# Patient Record
Sex: Female | Born: 1990 | Race: Black or African American | Hispanic: No | Marital: Single | Smoking: Former smoker
Health system: Southern US, Community
[De-identification: ages and names within clinical notes are randomized; demographics above are authoritative.]

## PROBLEM LIST (undated history)

## (undated) ENCOUNTER — Inpatient Hospital Stay (HOSPITAL_COMMUNITY): Payer: Self-pay

## (undated) DIAGNOSIS — A6000 Herpesviral infection of urogenital system, unspecified: Secondary | ICD-10-CM

## (undated) DIAGNOSIS — B9689 Other specified bacterial agents as the cause of diseases classified elsewhere: Secondary | ICD-10-CM

## (undated) DIAGNOSIS — N760 Acute vaginitis: Secondary | ICD-10-CM

## (undated) HISTORY — PX: WISDOM TOOTH EXTRACTION: SHX21

---

## 2009-06-26 ENCOUNTER — Emergency Department (HOSPITAL_COMMUNITY): Admission: EM | Admit: 2009-06-26 | Discharge: 2009-06-26 | Payer: Self-pay | Admitting: Family Medicine

## 2010-01-11 ENCOUNTER — Emergency Department (HOSPITAL_COMMUNITY)
Admission: EM | Admit: 2010-01-11 | Discharge: 2010-01-11 | Payer: Self-pay | Source: Home / Self Care | Admitting: Family Medicine

## 2010-02-15 ENCOUNTER — Emergency Department (HOSPITAL_COMMUNITY)
Admission: EM | Admit: 2010-02-15 | Discharge: 2010-02-15 | Disposition: A | Discharge status: Home / Self Care | Payer: Self-pay | Attending: Emergency Medicine | Admitting: Emergency Medicine

## 2010-02-15 DIAGNOSIS — S61209A Unspecified open wound of unspecified finger without damage to nail, initial encounter: Secondary | ICD-10-CM | POA: Insufficient documentation

## 2010-02-15 DIAGNOSIS — W268XXA Contact with other sharp object(s), not elsewhere classified, initial encounter: Secondary | ICD-10-CM | POA: Insufficient documentation

## 2010-03-23 LAB — POCT I-STAT, CHEM 8
Calcium, Ion: 1.17 mmol/L (ref 1.12–1.32)
Chloride: 103 mEq/L (ref 96–112)
HCT: 41 % (ref 36.0–46.0)
Potassium: 3.6 mEq/L (ref 3.5–5.1)
Sodium: 139 mEq/L (ref 135–145)

## 2010-03-23 LAB — POCT URINALYSIS DIP (DEVICE)
Bilirubin Urine: NEGATIVE
Glucose, UA: NEGATIVE mg/dL
Ketones, ur: NEGATIVE mg/dL

## 2010-03-23 LAB — POCT PREGNANCY, URINE: Preg Test, Ur: NEGATIVE

## 2010-11-30 ENCOUNTER — Emergency Department (INDEPENDENT_AMBULATORY_CARE_PROVIDER_SITE_OTHER)
Admission: EM | Admit: 2010-11-30 | Discharge: 2010-11-30 | Disposition: A | Discharge status: Home / Self Care | Payer: Managed Care, Other (non HMO) | Source: Home / Self Care

## 2010-11-30 DIAGNOSIS — K5900 Constipation, unspecified: Secondary | ICD-10-CM

## 2010-11-30 DIAGNOSIS — B9789 Other viral agents as the cause of diseases classified elsewhere: Secondary | ICD-10-CM

## 2010-11-30 DIAGNOSIS — B349 Viral infection, unspecified: Secondary | ICD-10-CM

## 2010-11-30 MED ORDER — PROMETHAZINE-CODEINE 6.25-10 MG/5ML PO SYRP: ORAL_SOLUTION | ORAL | Status: AC

## 2010-11-30 MED ORDER — DOCUSATE SODIUM 100 MG PO CAPS: 100.0000 mg | ORAL_CAPSULE | Freq: Two times a day (BID) | ORAL | Status: AC | PRN

## 2010-11-30 NOTE — ED Provider Notes (Signed)
History     CSN: [id number] Arrival date & time: 11/30/2010  6:31 PM   None     Chief Complaint  Patient presents with  . Sore Throat    Pt has cough, sorethroat, bodyaches, headache and chills  . Abdominal Pain    (Consider location/radiation/quality/duration/timing/severity/associated sxs/prior treatment) HPI Comments: Onset of cough, sore throat, body aches and fever today. No nasal congestion. Temp was 100 at home. Cough is nonproductive. Also today having cramping abdominal pain. Pt states she feels like she needs to have a BM but only has been able to pass a small hard stool. Uncertain of last normal stool, stating that she has been constipated for a few days now.   Patient is a 20 y.o. female presenting with abdominal pain and cough. The history is provided by the patient.  Abdominal Pain The primary symptoms of the illness include abdominal pain and fever. The primary symptoms of the illness do not include shortness of breath, nausea, vomiting, diarrhea, dysuria or vaginal discharge. The current episode started 6 to 12 hours ago. The onset of the illness was sudden. The problem has not changed since onset. The abdominal pain began 6 to 12 hours ago. The pain came on suddenly. The abdominal pain has been unchanged since its onset. The abdominal pain is generalized. The abdominal pain does not radiate. The abdominal pain is relieved by nothing.  The patient has had a change in bowel habit (pt reports being constipated and only able to pass small hard stool today). Additional symptoms associated with the illness include chills and constipation. Symptoms associated with the illness do not include urgency or frequency.  Cough This is a new problem. The current episode started 12 to 24 hours ago. The problem occurs every few minutes. The problem has not changed since onset.The cough is non-productive. The maximum temperature recorded prior to her arrival was 100 to 100.9 F. The fever has  been present for less than 1 day. Associated symptoms include chills, sore throat and myalgias. Pertinent negatives include no chest pain, no ear pain, no headaches, no rhinorrhea, no shortness of breath and no wheezing. She has tried cough syrup for the symptoms. The treatment provided no relief.    History reviewed. No pertinent past medical history.  History reviewed. No pertinent past surgical history.  History reviewed. No pertinent family history.  History  Substance Use Topics  . Smoking status: Not on file  . Smokeless tobacco: Not on file  . Alcohol Use: No    OB History    Grav Para Term Preterm Abortions TAB SAB Ect Mult Living                  Review of Systems  Constitutional: Positive for fever and chills. Negative for appetite change.  HENT: Positive for sore throat. Negative for ear pain, congestion, rhinorrhea and sinus pressure.   Respiratory: Positive for cough. Negative for shortness of breath and wheezing.   Cardiovascular: Negative for chest pain.  Gastrointestinal: Positive for abdominal pain and constipation. Negative for nausea, vomiting and diarrhea.  Genitourinary: Negative for dysuria, urgency, frequency, decreased urine volume and vaginal discharge.  Musculoskeletal: Positive for myalgias.  Neurological: Negative for headaches.    Allergies  Review of patient's allergies indicates no known allergies.  Home Medications   Current Outpatient Rx  Name Route Sig Dispense Refill  . DOCUSATE SODIUM 100 MG PO CAPS Oral Take 1 capsule (100 mg total) by mouth 2 (two) times daily as  needed for constipation. 20 capsule 0  . PROMETHAZINE-CODEINE 6.25-10 MG/5ML PO SYRP  1-2 tsp every 6 hrs prn cough 120 mL 0    BP 113/67  Pulse 66  Temp(Src) 99.1 F (37.3 C) (Oral)  Resp 17  SpO2 100%  LMP 11/19/2010  Physical Exam  Nursing note and vitals reviewed. Constitutional: She appears well-developed and well-nourished. No distress.  HENT:  Head:  Normocephalic and atraumatic.  Right Ear: Tympanic membrane, external ear and ear canal normal.  Left Ear: Tympanic membrane, external ear and ear canal normal.  Nose: Nose normal.  Mouth/Throat: Uvula is midline, oropharynx is clear and moist and mucous membranes are normal. No oropharyngeal exudate, posterior oropharyngeal edema or posterior oropharyngeal erythema.  Neck: Neck supple.  Cardiovascular: Normal rate, regular rhythm and normal heart sounds.   Pulmonary/Chest: Effort normal and breath sounds normal. No respiratory distress.  Abdominal: Soft. Bowel sounds are normal. She exhibits no distension and no mass. There is no tenderness.  Lymphadenopathy:    She has no cervical adenopathy.  Neurological: She is alert.  Skin: Skin is warm and dry.  Psychiatric: She has a normal mood and affect.    ED Course  Procedures (including critical care time)  Labs Reviewed - No data to display No results found.   1. Viral infection   2. Constipation       MDM          Melody Comas, PA 11/30/10 1914

## 2010-12-01 NOTE — ED Provider Notes (Signed)
Medical screening examination/treatment/procedure(s) were performed by non-physician practitioner and as supervising physician I was immediately available for consultation/collaboration.  Raynald Blend, MD 12/01/10 [phone]

## 2011-07-08 ENCOUNTER — Emergency Department (HOSPITAL_COMMUNITY)
Admission: EM | Admit: 2011-07-08 | Discharge: 2011-07-08 | Disposition: A | Discharge status: Home / Self Care | Payer: Self-pay | Source: Home / Self Care | Attending: Family Medicine | Admitting: Family Medicine

## 2011-07-08 ENCOUNTER — Encounter (HOSPITAL_COMMUNITY): Payer: Self-pay | Admitting: *Deleted

## 2011-07-08 DIAGNOSIS — K529 Noninfective gastroenteritis and colitis, unspecified: Secondary | ICD-10-CM

## 2011-07-08 NOTE — ED Notes (Signed)
Pt    Reports  Nausea  Vomiting  /  Diarrhea          X  2  Days  No  Vomiting  yest  Feels  Like  She  Could  -  Mother  Sick as  Well after  Eating  Same  Place

## 2011-07-08 NOTE — ED Provider Notes (Signed)
History     CSN: [id number]  Arrival date & time 07/08/11  1121   First MD Initiated Contact with Patient 07/08/11 1228      Chief Complaint  Patient presents with  . Nausea    (Consider location/radiation/quality/duration/timing/severity/associated sxs/prior treatment) HPI Comments: Pt's mother has similar sx and they ate together evening of 6/30.  Morning of 7/1 pt had abd cramping and loose stool x1.  Yesterday pt had 2 episodes watery brown stool.  None today.  Has been eating only crackers and drinking only ginger ale and water.    Patient is a 21 y.o. female presenting with diarrhea. The history is provided by the patient.  Diarrhea The primary symptoms include nausea and diarrhea. Primary symptoms do not include fever, vomiting or dysuria. The illness began 2 days ago. The onset was sudden. The problem has been gradually improving.  Nausea began 2 days ago.  The diarrhea began 2 days ago. The diarrhea is watery. The diarrhea occurs 2 to 4 times per day. Risk factors for illness producing diarrhea include suspect food intake.  The illness does not include chills.    History reviewed. No pertinent past medical history.  History reviewed. No pertinent past surgical history.  No family history on file.  History  Substance Use Topics  . Smoking status: Not on file  . Smokeless tobacco: Not on file  . Alcohol Use: No    OB History    Grav Para Term Preterm Abortions TAB SAB Ect Mult Living                  Review of Systems  Constitutional: Negative for fever and chills.  Gastrointestinal: Positive for nausea and diarrhea. Negative for vomiting and blood in stool.  Genitourinary: Negative for dysuria.    Allergies  Review of patient's allergies indicates no known allergies.  Home Medications  No current outpatient prescriptions on file.  BP 106/66  Pulse 53  Temp 98.3 F (36.8 C) (Oral)  Resp 16  SpO2 100%  LMP 06/18/2011  Physical Exam    Constitutional: She appears well-developed and well-nourished. No distress.       Ill appearing  Cardiovascular: Normal rate and regular rhythm.   Pulmonary/Chest: Effort normal and breath sounds normal.  Abdominal: Soft. Bowel sounds are normal. She exhibits no distension. There is no tenderness. There is no rigidity, no rebound, no guarding and no CVA tenderness.  Skin: Skin is warm, dry and intact.    ED Course  Procedures (including critical care time)  Labs Reviewed - No data to display No results found.   1. Enteritis       MDM          Cathlyn Parsons, NP 07/08/11 1233

## 2011-07-08 NOTE — ED Provider Notes (Signed)
Medical screening examination/treatment/procedure(s) were performed by non-physician practitioner and as supervising physician I was immediately available for consultation/collaboration.   [facility], [place]; MD   Sharin Grave, MD 07/08/11 2158

## 2012-01-21 ENCOUNTER — Emergency Department (HOSPITAL_COMMUNITY)
Admission: EM | Admit: 2012-01-21 | Discharge: 2012-01-21 | Disposition: A | Discharge status: Home / Self Care | Payer: Self-pay | Attending: Emergency Medicine | Admitting: Emergency Medicine

## 2012-01-21 ENCOUNTER — Encounter (HOSPITAL_COMMUNITY): Payer: Self-pay | Admitting: Emergency Medicine

## 2012-01-21 DIAGNOSIS — R059 Cough, unspecified: Secondary | ICD-10-CM | POA: Insufficient documentation

## 2012-01-21 DIAGNOSIS — J3489 Other specified disorders of nose and nasal sinuses: Secondary | ICD-10-CM | POA: Insufficient documentation

## 2012-01-21 DIAGNOSIS — R05 Cough: Secondary | ICD-10-CM | POA: Insufficient documentation

## 2012-01-21 DIAGNOSIS — J069 Acute upper respiratory infection, unspecified: Secondary | ICD-10-CM | POA: Insufficient documentation

## 2012-01-21 DIAGNOSIS — H9209 Otalgia, unspecified ear: Secondary | ICD-10-CM | POA: Insufficient documentation

## 2012-01-21 DIAGNOSIS — F17200 Nicotine dependence, unspecified, uncomplicated: Secondary | ICD-10-CM | POA: Insufficient documentation

## 2012-01-21 DIAGNOSIS — R6889 Other general symptoms and signs: Secondary | ICD-10-CM | POA: Insufficient documentation

## 2012-01-21 LAB — RAPID STREP SCREEN (MED CTR MEBANE ONLY): Streptococcus, Group A Screen (Direct): NEGATIVE

## 2012-01-21 MED ORDER — GUAIFENESIN 100 MG/5ML PO LIQD: 100.0000 mg | ORAL | Status: DC | PRN

## 2012-01-21 NOTE — ED Provider Notes (Signed)
Medical screening examination/treatment/procedure(s) were performed by non-physician practitioner and as supervising physician I was immediately available for consultation/collaboration.   Jotham Ahn, MD 01/21/12 1417 

## 2012-01-21 NOTE — ED Provider Notes (Signed)
History     CSN: [id number]  Arrival date & time 01/21/12  0945   None     No chief complaint on file.   (Consider location/radiation/quality/duration/timing/severity/associated sxs/prior treatment) HPI  22 year old female presents c/o L ear pain and sore throat.  Pt reports for the past 1 week she developed gradual onset of sore throat, nasal congestion, L ear pain, and headache.  Has occasional sneeze and cough.  Onset is gradual, persistent, moderate in severity, unrelieved with OTC medication.  Denies fever, chill,s hearing changes, voice changes, cp, sob, abd pain, n/v/d, or rash.    History reviewed. No pertinent past medical history.  History reviewed. No pertinent past surgical history.  No family history on file.  History  Substance Use Topics  . Smoking status: Current Every Day Smoker  . Smokeless tobacco: Not on file  . Alcohol Use: Yes    OB History    Grav Para Term Preterm Abortions TAB SAB Ect Mult Living                  Review of Systems  Constitutional:       10 Systems reviewed and all are negative for acute change except as noted in the HPI.     Allergies  Review of patient's allergies indicates no known allergies.  Home Medications   Current Outpatient Rx  Name  Route  Sig  Dispense  Refill  . NAPROXEN SODIUM 220 MG PO TABS   Oral   Take 220 mg by mouth 2 (two) times daily as needed. For pain           BP 122/52  Pulse 69  Temp 98.3 F (36.8 C)  Resp 16  SpO2 99%  Physical Exam  Nursing note and vitals reviewed. Constitutional: She is oriented to person, place, and time. She appears well-developed and well-nourished. No distress.  HENT:  Head: Normocephalic and atraumatic.  Right Ear: External ear normal.  Left Ear: External ear normal.       Nose: mild rhinorrhea  Throat: uvula midline.  Mild L tonsillar enlargement and exudates.  No evidence of deep tissue infection.    No voice changes.  Tolerates own saliva secretion   Eyes: Conjunctivae normal and EOM are normal. Pupils are equal, round, and reactive to light.  Neck: Normal range of motion. Neck supple.  Cardiovascular: Normal rate and regular rhythm.  Exam reveals no gallop and no friction rub.   No murmur heard. Pulmonary/Chest: Effort normal and breath sounds normal. No respiratory distress. She has no wheezes. She exhibits no tenderness.  Abdominal: Soft. There is no tenderness.  Musculoskeletal: Normal range of motion. She exhibits no edema and no tenderness.  Lymphadenopathy:    She has cervical adenopathy (anterior cervical lymphadenopathy.  ).  Neurological: She is alert and oriented to person, place, and time.  Skin: Skin is warm. No rash noted.  Psychiatric: She has a normal mood and affect.    ED Course  Procedures (including critical care time)   Labs Reviewed  RAPID STREP SCREEN   Results for orders placed during the hospital encounter of 01/21/12  RAPID STREP SCREEN      Component Value Range   Streptococcus, Group A Screen (Direct) NEGATIVE  NEGATIVE   No results found.   1. URI  MDM  Pt presents with sore throat and URI sxs.  Strep test neg.  No evidence of deep tissue infection.  Appears nontoxic, able to swallow without difficulty.  I recommend gargle with salt water and will give guaifenesin for congestions.  ENT referral as needed.    BP 122/52  Pulse 69  Temp 98.3 F (36.8 C)  Resp 16  SpO2 99%  I have reviewed nursing notes and vital signs.  I reviewed available ER/hospitalization records thought the EMR     Fayrene Helper, [place] 01/21/12 1240

## 2012-01-21 NOTE — ED Notes (Signed)
Left ear pain x 1 week  And bad sorethroat also

## 2012-07-25 ENCOUNTER — Encounter (HOSPITAL_COMMUNITY): Payer: Self-pay | Admitting: Emergency Medicine

## 2012-07-25 ENCOUNTER — Emergency Department (HOSPITAL_COMMUNITY)
Admission: EM | Admit: 2012-07-25 | Discharge: 2012-07-25 | Disposition: A | Discharge status: Home / Self Care | Payer: Self-pay | Attending: Emergency Medicine | Admitting: Emergency Medicine

## 2012-07-25 DIAGNOSIS — A6000 Herpesviral infection of urogenital system, unspecified: Secondary | ICD-10-CM | POA: Insufficient documentation

## 2012-07-25 DIAGNOSIS — N760 Acute vaginitis: Secondary | ICD-10-CM | POA: Insufficient documentation

## 2012-07-25 DIAGNOSIS — A499 Bacterial infection, unspecified: Secondary | ICD-10-CM | POA: Insufficient documentation

## 2012-07-25 DIAGNOSIS — Z3202 Encounter for pregnancy test, result negative: Secondary | ICD-10-CM | POA: Insufficient documentation

## 2012-07-25 DIAGNOSIS — B9689 Other specified bacterial agents as the cause of diseases classified elsewhere: Secondary | ICD-10-CM | POA: Insufficient documentation

## 2012-07-25 DIAGNOSIS — F17200 Nicotine dependence, unspecified, uncomplicated: Secondary | ICD-10-CM | POA: Insufficient documentation

## 2012-07-25 LAB — URINALYSIS, ROUTINE W REFLEX MICROSCOPIC
Glucose, UA: NEGATIVE mg/dL
Ketones, ur: NEGATIVE mg/dL
Leukocytes, UA: NEGATIVE
pH: 6.5 (ref 5.0–8.0)

## 2012-07-25 LAB — WET PREP, GENITAL: Trich, Wet Prep: NONE SEEN

## 2012-07-25 LAB — POCT PREGNANCY, URINE: Preg Test, Ur: NEGATIVE

## 2012-07-25 MED ORDER — VALACYCLOVIR HCL 1 G PO TABS: 1000.0000 mg | ORAL_TABLET | Freq: Three times a day (TID) | ORAL | Status: AC

## 2012-07-25 MED ORDER — METRONIDAZOLE 500 MG PO TABS: 500.0000 mg | ORAL_TABLET | Freq: Two times a day (BID) | ORAL | Status: DC

## 2012-07-25 NOTE — ED Notes (Signed)
Pt escorted to discharge window. Pt verbalized understanding discharge instructions. In no acute distress.  

## 2012-07-25 NOTE — Progress Notes (Signed)
[facility] has seen patient. Provided her with a list of primary resources.

## 2012-07-25 NOTE — ED Notes (Signed)
Pt states her partner was diagnosed with herpes. Pt reports vaginal irritation and bumps. Pt reports thin discharge.

## 2012-07-25 NOTE — ED Provider Notes (Signed)
   History    CSN: [id number] Arrival date & time 07/25/12  0803  First MD Initiated Contact with Patient 07/25/12 [phone]     Chief Complaint  Patient presents with  . Vaginal Itching   (Consider location/radiation/quality/duration/timing/severity/associated sxs/prior Treatment) Patient is a 22 y.o. female presenting with vaginal itching. The history is provided by the patient.  Vaginal Itching   patient here complaining of vaginal pain at her right labia the rash. Is concerned that she may have been exposed to genital herpes. Denies any anginal bleeding or discharge. No abdominal pain. No fever. Denies any prior history of STDs. Symptoms constant and have been recurrent. No treatment used prior to arrival History reviewed. No pertinent past medical history. History reviewed. No pertinent past surgical history. History reviewed. No pertinent family history. History  Substance Use Topics  . Smoking status: Current Every Day Smoker  . Smokeless tobacco: Not on file  . Alcohol Use: Yes   OB History   Grav Para Term Preterm Abortions TAB SAB Ect Mult Living                 Review of Systems  All other systems reviewed and are negative.    Allergies  Review of patient's allergies indicates no known allergies.  Home Medications  No current outpatient prescriptions on file. BP 100/62  Pulse 56  Temp(Src) 97.7 F (36.5 C) (Oral)  Resp 16  SpO2 100% Physical Exam  Nursing note and vitals reviewed. Constitutional: She is oriented to person, place, and time. She appears well-developed and well-nourished.  Non-toxic appearance. No distress.  HENT:  Head: Normocephalic and atraumatic.  Eyes: Conjunctivae, EOM and lids are normal. Pupils are equal, round, and reactive to light.  Neck: Normal range of motion. Neck supple. No tracheal deviation present. No mass present.  Cardiovascular: Normal rate, regular rhythm and normal heart sounds.  Exam reveals no gallop.   No murmur  heard. Pulmonary/Chest: Effort normal and breath sounds normal. No stridor. No respiratory distress. She has no decreased breath sounds. She has no wheezes. She has no rhonchi. She has no rales.  Abdominal: Soft. Normal appearance and bowel sounds are normal. She exhibits no distension. There is no tenderness. There is no rebound and no CVA tenderness.  Genitourinary:     Musculoskeletal: Normal range of motion. She exhibits no edema and no tenderness.  Neurological: She is alert and oriented to person, place, and time. She has normal strength. No cranial nerve deficit or sensory deficit. GCS eye subscore is 4. GCS verbal subscore is 5. GCS motor subscore is 6.  Skin: Skin is warm and dry. No abrasion and no rash noted.  Psychiatric: She has a normal mood and affect. Her speech is normal and behavior is normal.    ED Course  Procedures (including critical care time) Labs Reviewed  WET PREP, GENITAL  GC/CHLAMYDIA PROBE AMP  URINALYSIS, ROUTINE W REFLEX MICROSCOPIC   No results found. No diagnosis found.  MDM  Patient had an RPR and HIV test done which are pending at this time. Patient's lesions consistent with herpes and be treated for such. She was also given safe sex counseling  Toy Baker, MD 07/25/12 [phone]

## 2012-07-27 NOTE — ED Notes (Signed)
Chart sent to EDP office for review °

## 2012-07-28 ENCOUNTER — Telehealth (HOSPITAL_COMMUNITY): Payer: Self-pay | Admitting: *Deleted

## 2012-07-29 NOTE — ED Notes (Signed)
Chart returned from EDP office.Rocephin 250 mg IM x 1.Advise partner should be tested/treated. Written by Sharilyn Sites

## 2012-07-31 ENCOUNTER — Emergency Department (HOSPITAL_COMMUNITY)
Admission: EM | Admit: 2012-07-31 | Discharge: 2012-07-31 | Disposition: A | Discharge status: Home / Self Care | Payer: Self-pay | Attending: Emergency Medicine | Admitting: Emergency Medicine

## 2012-07-31 ENCOUNTER — Encounter (HOSPITAL_COMMUNITY): Payer: Self-pay | Admitting: Emergency Medicine

## 2012-07-31 ENCOUNTER — Telehealth (HOSPITAL_COMMUNITY): Payer: Self-pay | Admitting: Emergency Medicine

## 2012-07-31 DIAGNOSIS — F17200 Nicotine dependence, unspecified, uncomplicated: Secondary | ICD-10-CM | POA: Insufficient documentation

## 2012-07-31 DIAGNOSIS — A549 Gonococcal infection, unspecified: Secondary | ICD-10-CM

## 2012-07-31 DIAGNOSIS — N898 Other specified noninflammatory disorders of vagina: Secondary | ICD-10-CM | POA: Insufficient documentation

## 2012-07-31 DIAGNOSIS — Z8619 Personal history of other infectious and parasitic diseases: Secondary | ICD-10-CM | POA: Insufficient documentation

## 2012-07-31 DIAGNOSIS — A5400 Gonococcal infection of lower genitourinary tract, unspecified: Secondary | ICD-10-CM | POA: Insufficient documentation

## 2012-07-31 DIAGNOSIS — Z79899 Other long term (current) drug therapy: Secondary | ICD-10-CM | POA: Insufficient documentation

## 2012-07-31 HISTORY — DX: Herpesviral infection of urogenital system, unspecified: A60.00

## 2012-07-31 HISTORY — DX: Other specified bacterial agents as the cause of diseases classified elsewhere: N76.0

## 2012-07-31 HISTORY — DX: Acute vaginitis: B96.89

## 2012-07-31 MED ORDER — CEFTRIAXONE SODIUM 250 MG IJ SOLR
250.0000 mg | Freq: Once | INTRAMUSCULAR | Status: AC
  Administered 2012-07-31: 250 mg via INTRAMUSCULAR
  Filled 2012-07-31: qty 250

## 2012-07-31 NOTE — ED Provider Notes (Signed)
Medical screening examination/treatment/procedure(s) were performed by non-physician practitioner and as supervising physician I was immediately available for consultation/collaboration.  Renay Crammer, MD 07/31/12 0507 

## 2012-07-31 NOTE — ED Provider Notes (Signed)
CSN: [id number]     Arrival date & time 07/31/12  0013 History     First MD Initiated Contact with Patient 07/31/12 0037     Chief Complaint  Patient presents with  . SEXUALLY TRANSMITTED DISEASE   (Consider location/radiation/quality/duration/timing/severity/associated sxs/prior Treatment) The history is provided by the patient and medical records. No language interpreter was used.    Joy [name] is a 22 y.o. female  with a hx of genital herpes and BV presents to the Emergency Department after checking on her labs to find out she had a positive Gonorrhea test.  Pt states she continues to have vagina discharge but has no other symptoms.  Pt states the herpes outbreak in healing. Nothing makes it better or worse.  Pt denies fever, chills, headache, neck pain, chest pain, shortness of breath, abdominal pain, nausea, vomiting, diarrhea, weakness, dizziness, syncope, dysuria, hematuria.     Past Medical History  Diagnosis Date  . Genital herpes   . BV (bacterial vaginosis)    History reviewed. No pertinent past surgical history. No family history on file. History  Substance Use Topics  . Smoking status: Current Every Day Smoker  . Smokeless tobacco: Not on file  . Alcohol Use: Yes   OB History   Grav Para Term Preterm Abortions TAB SAB Ect Mult Living                 Review of Systems  Constitutional: Negative for fever, diaphoresis, appetite change, fatigue and unexpected weight change.  HENT: Negative for mouth sores and neck stiffness.   Respiratory: Negative for cough, chest tightness, shortness of breath and wheezing.   Cardiovascular: Negative for chest pain.  Gastrointestinal: Negative for nausea, vomiting, abdominal pain, diarrhea and constipation.  Genitourinary: Positive for vaginal discharge. Negative for dysuria, urgency, frequency and hematuria.  Musculoskeletal: Negative for back pain.  Skin: Negative for rash.  Allergic/Immunologic: Negative for  immunocompromised state.  Neurological: Negative for syncope, light-headedness and headaches.  Hematological: Does not bruise/bleed easily.  Psychiatric/Behavioral: Negative for sleep disturbance. The patient is not nervous/anxious.     Allergies  Review of patient's allergies indicates no known allergies.  Home Medications   Current Outpatient Rx  Name  Route  Sig  Dispense  Refill  . metroNIDAZOLE (FLAGYL) 500 MG tablet   Oral   Take 1 tablet (500 mg total) by mouth 2 (two) times daily.   14 tablet   0   . valACYclovir (VALTREX) 1000 MG tablet   Oral   Take 1 tablet (1,000 mg total) by mouth 3 (three) times daily.   30 tablet   0    BP 119/69  Pulse 60  Temp(Src) 99.2 F (37.3 C) (Oral)  Resp 18  Ht 5\' 8"  (1.727 m)  Wt 210 lb (95.255 kg)  BMI 31.94 kg/m2  SpO2 98%  LMP 07/05/2012 Physical Exam  Nursing note and vitals reviewed. Constitutional: She appears well-developed and well-nourished. No distress.  Awake, alert, nontoxic appearance  HENT:  Head: Normocephalic and atraumatic.  Mouth/Throat: Oropharynx is clear and moist. No oropharyngeal exudate.  Eyes: Conjunctivae are normal. No scleral icterus.  Neck: Normal range of motion.  Cardiovascular: Normal rate, regular rhythm, normal heart sounds and intact distal pulses.   No murmur heard. Pulmonary/Chest: Effort normal and breath sounds normal. No respiratory distress. She has no wheezes.  Abdominal: Soft. Bowel sounds are normal. She exhibits no distension. There is no tenderness. There is no rebound.  Musculoskeletal: Normal range of motion. She  exhibits no edema.  Neurological: She is alert.  Speech is clear and goal oriented Moves extremities without ataxia  Skin: Skin is warm and dry. She is not diaphoretic. No erythema.  Psychiatric: She has a normal mood and affect.    ED Course   Procedures (including critical care time)  Labs Reviewed - No data to display No results found. 1. Gonorrhea in  female     MDM  Joy [name] presents for treatment of a positive gonorrhea culture taken on 07/25/2012.  Patient was not treated the day of the visit.  Patient to be discharged with instructions to follow up with OBGYN. Discussed importance of using protection when sexually active. Pt understands that they will need to inform all sexual partners. Pt has been treated rocephin.  I have also discussed reasons to return immediately to the ER.  Patient expresses understanding and agrees with plan.    Dahlia Client Dalonte Hardage, PA-C 07/31/12 [phone]

## 2012-07-31 NOTE — ED Notes (Signed)
Unable to contact patient via phone. Sent letter. °

## 2012-07-31 NOTE — ED Notes (Signed)
Pt returned to ED to discuss results of GC probe here on 7/21, pt did have positive result. Pt received no antibiotics the day of visit.

## 2014-12-08 ENCOUNTER — Encounter (HOSPITAL_COMMUNITY): Payer: Self-pay

## 2014-12-08 ENCOUNTER — Inpatient Hospital Stay (HOSPITAL_COMMUNITY)
Admission: AD | Admit: 2014-12-08 | Discharge: 2014-12-08 | Disposition: A | Discharge status: Home / Self Care | Payer: Medicaid Other | Source: Ambulatory Visit | Attending: Obstetrics & Gynecology | Admitting: Obstetrics & Gynecology

## 2014-12-08 DIAGNOSIS — Z3200 Encounter for pregnancy test, result unknown: Secondary | ICD-10-CM | POA: Diagnosis present

## 2014-12-08 DIAGNOSIS — Z3201 Encounter for pregnancy test, result positive: Secondary | ICD-10-CM

## 2014-12-08 LAB — URINALYSIS, ROUTINE W REFLEX MICROSCOPIC
Glucose, UA: NEGATIVE mg/dL
Hgb urine dipstick: NEGATIVE
Ketones, ur: >80 mg/dL — AB
Leukocytes, UA: NEGATIVE
Nitrite: NEGATIVE
Protein, ur: NEGATIVE mg/dL
Specific Gravity, Urine: 1.025 (ref 1.005–1.030)
pH: 6.5 (ref 5.0–8.0)

## 2014-12-08 LAB — POCT PREGNANCY, URINE: Preg Test, Ur: POSITIVE — AB

## 2014-12-08 MED ORDER — PROMETHAZINE HCL 25 MG PO TABS: 25.0000 mg | ORAL_TABLET | Freq: Four times a day (QID) | ORAL | Status: DC | PRN

## 2014-12-08 NOTE — Discharge Instructions (Signed)
Prenatal Care °WHAT IS PRENATAL CARE?  °Prenatal care is the process of caring for a pregnant woman before she gives birth. Prenatal care makes sure that she and her baby remain as healthy as possible throughout pregnancy. Prenatal care may be provided by a midwife, family practice health care provider, or a childbirth and pregnancy specialist (obstetrician). Prenatal care may include physical examinations, testing, treatments, and education on nutrition, lifestyle, and social support services. °WHY IS PRENATAL CARE SO IMPORTANT?  °Early and consistent prenatal care increases the chance that you and your baby will remain healthy throughout your pregnancy. This type of care also decreases a baby's risk of being born too early (prematurely), or being born smaller than expected (small for gestational age). Any underlying medical conditions you may have that could pose a risk during your pregnancy are discussed during prenatal care visits. You will also be monitored regularly for any new conditions that may arise during your pregnancy so they can be treated quickly and effectively. °WHAT HAPPENS DURING PRENATAL CARE VISITS? °Prenatal care visits may include the following: °Discussion °Tell your health care provider about any new signs or symptoms you have experienced since your last visit. These might include: °· Nausea or vomiting. °· Increased or decreased level of energy. °· Difficulty sleeping. °· Back or leg pain. °· Weight changes. °· Frequent urination. °· Shortness of breath with physical activity. °· Changes in your skin, such as the development of a rash or itchiness. °· Vaginal discharge or bleeding. °· Feelings of excitement or nervousness. °· Changes in your baby's movements. °You may want to write down any questions or topics you want to discuss with your health care provider and bring them with you to your appointment. °Examination °During your first prenatal care visit, you will likely have a complete  physical exam. Your health care provider will often examine your vagina, cervix, and the position of your uterus, as well as check your heart, lungs, and other body systems. As your pregnancy progresses, your health care provider will measure the size of your uterus and your baby's position inside your uterus. He or she may also examine you for early signs of labor. Your prenatal visits may also include checking your blood pressure and, after about 10-12 weeks of pregnancy, listening to your baby's heartbeat. °Testing °Regular testing often includes: °· Urinalysis. This checks your urine for glucose, protein, or signs of infection. °· Blood count. This checks the levels of white and red blood cells in your body. °· Tests for sexually transmitted infections (STIs). Testing for STIs at the beginning of pregnancy is routinely done and is required in many states. °· Antibody testing. You will be checked to see if you are immune to certain illnesses, such as rubella, that can affect a developing fetus. °· Glucose screen. Around 24-28 weeks of pregnancy, your blood glucose level will be checked for signs of gestational diabetes. Follow-up tests may be recommended. °· Group B strep. This is a bacteria that is commonly found inside a woman's vagina. This test will inform your health care provider if you need an antibiotic to reduce the amount of this bacteria in your body prior to labor and childbirth. °· Ultrasound. Many pregnant women undergo an ultrasound screening around 18-20 weeks of pregnancy to evaluate the health of the fetus and check for any developmental abnormalities. °· HIV (human immunodeficiency virus) testing. Early in your pregnancy, you will be screened for HIV. If you are at high risk for HIV, this test   may be repeated during your third trimester of pregnancy. °You may be offered other testing based on your age, personal or family medical history, or other factors.  °HOW OFTEN SHOULD I PLAN TO SEE MY  HEALTH CARE PROVIDER FOR PRENATAL CARE? °Your prenatal care check-up schedule depends on any medical conditions you have before, or develop during, your pregnancy. If you do not have any underlying medical conditions, you will likely be seen for checkups: °· Monthly, during the first 6 months of pregnancy. °· Twice a month during months 7 and 8 of pregnancy. °· Weekly starting in the 9th month of pregnancy and until delivery. °If you develop signs of early labor or other concerning signs or symptoms, you may need to see your health care provider more often. Ask your health care provider what prenatal care schedule is best for you. °WHAT CAN I DO TO KEEP MYSELF AND MY BABY AS HEALTHY AS POSSIBLE DURING MY PREGNANCY? °· Take a prenatal vitamin containing 400 micrograms (0.4 mg) of folic acid every day. Your health care provider may also ask you to take additional vitamins such as iodine, vitamin D, iron, copper, and zinc. °· Take 1500-2000 mg of calcium daily starting at your 20th week of pregnancy until you deliver your baby. °· Make sure you are up to date on your vaccinations. Unless directed otherwise by your health care provider: °¨ You should receive a tetanus, diphtheria, and pertussis (Tdap) vaccination between the 27th and 36th week of your pregnancy, regardless of when your last Tdap immunization occurred. This helps protect your baby from whooping cough (pertussis) after he or she is born. °¨ You should receive an annual inactivated influenza vaccine (IIV) to help protect you and your baby from influenza. This can be done at any point during your pregnancy. °· Eat a well-rounded diet that includes: °¨ Fresh fruits and vegetables. °¨ Lean proteins. °¨ Calcium-rich foods such as milk, yogurt, hard cheeses, and dark, leafy greens. °¨ Whole grain breads. °· Do not eat seafood high in mercury, including: °¨ Swordfish. °¨ Tilefish. °¨ Shark. °¨ King mackerel. °¨ More than 6 oz tuna per week. °· Do not eat: °¨ Raw  or undercooked meats or eggs. °¨ Unpasteurized foods, such as soft cheeses (brie, blue, or feta), juices, and milks. °¨ Lunch meats. °¨ Hot dogs that have not been heated until they are steaming. °· Drink enough water to keep your urine clear or pale yellow. For many women, this may be 10 or more 8 oz glasses of water each day. Keeping yourself hydrated helps deliver nutrients to your baby and may prevent the start of pre-term uterine contractions. °· Do not use any tobacco products including cigarettes, chewing tobacco, or electronic cigarettes. If you need help quitting, ask your health care provider. °· Do not drink beverages containing alcohol. No safe level of alcohol consumption during pregnancy has been determined. °· Do not use any illegal drugs. These can harm your developing baby or cause a miscarriage. °· Ask your health care provider or pharmacist before taking any prescription or over-the-counter medicines, herbs, or supplements. °· Limit your caffeine intake to no more than 200 mg per day. °· Exercise. Unless told otherwise by your health care provider, try to get 30 minutes of moderate exercise most days of the week. Do not  do high-impact activities, contact sports, or activities with a high risk of falling, such as horseback riding or downhill skiing. °· Get plenty of rest. °· Avoid anything that raises your   body temperature, such as hot tubs and saunas.  If you own a cat, do not empty its litter box. Bacteria contained in cat feces can cause an infection called toxoplasmosis. This can result in serious harm to the fetus.  Stay away from chemicals such as insecticides, lead, mercury, and cleaning or paint products that contain solvents.  Do not have any X-rays taken unless medically necessary.  Take a childbirth and breastfeeding preparation class. Ask your health care provider if you need a referral or recommendation.   This information is not intended to replace advice given to you by  your health care provider. Make sure you discuss any questions you have with your health care provider.   Document Released: 12/25/2002 Document Revised: 01/12/2014 Document Reviewed: 03/08/2013 Elsevier Interactive Patient Education 2016 [employer] Prenatal Care Providers [facility]    [facility]  [phone]     Phone: [phone]          [facility]                      [facility]  @[place]     Phone: [phone]  Phone: [phone]         [facility]     Phone: [phone]  Phone: [phone]           [facility] [place]                hone: [phone]  Phone: [phone]         [facility] Dr[facility] Francoise CeoBernard Marshall      Phone: [phone]  Phone: [phone]         [facility]                Phone: [phone]  [facility]   [phone]:[phone]    [facility] [address] [place]([place])          Phone: [phone] [facility]   Phone: [phone] Medications in Pregnancy   Acne: Benzoyl Peroxide Salicylic Acid  Backache/Headache: Tylenol: 2 regular strength every 4 hours OR              2 Extra strength every 6 hours  Colds/Coughs/Allergies: Benadryl (alcohol free) 25 mg every 6 hours as needed Breath right strips Claritin Cepacol throat lozenges Chloraseptic throat spray Cold-Eeze- up to three times per day Cough drops, alcohol free Flonase (by prescription only) Guaifenesin Mucinex Robitussin DM (plain only, alcohol free) Saline nasal spray/drops Sudafed (pseudoephedrine) & Actifed ** use only after [redacted] weeks gestation and if you do not have high blood pressure Tylenol Vicks Vaporub Zinc lozenges Zyrtec   Constipation: Colace Ducolax suppositories Fleet enema Glycerin suppositories Metamucil Milk of magnesia Miralax Senokot Smooth move  tea  Diarrhea: Kaopectate Imodium A-D  *NO pepto Bismol  Hemorrhoids: Anusol Anusol HC Preparation H Tucks  Indigestion: Tums Maalox Mylanta Zantac  Pepcid  Insomnia: Benadryl (alcohol free) 25mg  every 6 hours as needed Tylenol PM Unisom, no Gelcaps  Leg Cramps: Tums MagGel  Nausea/Vomiting:  Bonine Dramamine Emetrol Ginger extract Sea bands Meclizine  Nausea medication to take during pregnancy:  Unisom (doxylamine succinate 25 mg tablets) Take one tablet daily at bedtime. If symptoms are not adequately controlled, the dose can be increased to a maximum recommended dose of two tablets daily (1/2 tablet in the morning, 1/2  tablet mid-afternoon and one at bedtime). Vitamin B6  tablets. Take one tablet twice a day (up to 200 mg per day).  Skin Rashes: Aveeno products Benadryl cream or  every 6 hours as needed Calamine Lotion 1% cortisone cream  Yeast infection: Gyne-lotrimin 7 Monistat 7   **If taking multiple medications, please check labels to avoid duplicating the same active ingredients **take medication as directed on the label ** Do not exceed 4000 mg of tylenol in 24 hours **Do not take medications that contain aspirin or ibuprofen   Safe Medications in Pregnancy   Acne: Benzoyl Peroxide Salicylic Acid  Backache/Headache: Tylenol: 2 regular strength every 4 hours OR              2 Extra strength every 6 hours  Colds/Coughs/Allergies: Benadryl (alcohol free) 25 mg every 6 hours as needed Breath right strips Claritin Cepacol throat lozenges Chloraseptic throat spray Cold-Eeze- up to three times per day Cough drops, alcohol free Flonase (by prescription only) Guaifenesin Mucinex Robitussin DM (plain only, alcohol free) Saline nasal spray/drops Sudafed (pseudoephedrine) & Actifed ** use only after [redacted] weeks gestation and if you do not have high blood pressure Tylenol Vicks Vaporub Zinc lozenges Zyrtec    Constipation: Colace Ducolax suppositories Fleet enema Glycerin suppositories Metamucil Milk of magnesia Miralax Senokot Smooth move tea  Diarrhea: Kaopectate Imodium A-D  *NO pepto Bismol  Hemorrhoids: Anusol Anusol HC Preparation H Tucks  Indigestion: Tums Maalox Mylanta Zantac  Pepcid  Insomnia: Benadryl (alcohol free)  every 6 hours as needed Tylenol PM Unisom, no Gelcaps  Leg Cramps: Tums MagGel  Nausea/Vomiting:  Bonine Dramamine Emetrol Ginger extract Sea bands Meclizine  Nausea medication to take during pregnancy:  Unisom (doxylamine succinate 25 mg tablets) Take one tablet daily at bedtime. If symptoms are not adequately controlled, the dose can be increased to a maximum recommended dose of two tablets daily (1/2 tablet in the morning, 1/2 tablet mid-afternoon and one at bedtime). Vitamin B6  tablets. Take one tablet twice a day (up to 200 mg per day).  Skin Rashes: Aveeno products Benadryl cream or  every 6 hours as needed Calamine Lotion 1% cortisone cream  Yeast infection: Gyne-lotrimin 7 Monistat 7   **If taking multiple medications, please check labels to avoid duplicating the same active ingredients **take medication as directed on the label ** Do not exceed 4000 mg of tylenol in 24 hours **Do not take medications that contain aspirin or ibuprofen

## 2014-12-08 NOTE — MAU Provider Note (Signed)
S:  Joy [name] is a 24 y.o. G1P0 at 3284w4d wks here for confirmation of pregnancy.  Patient's Patient's last menstrual period was 10/30/2014.Marland Kitchen.  Denies any vaginal bleeding or abdominal pain.    O:  Past Medical History  Diagnosis Date  . Genital herpes   . BV (bacterial vaginosis)     No family history on file.   Filed Vitals:   12/08/14 1800  BP: 119/55  Pulse: 70  Temp: 98.5 F (36.9 C)  Resp: 18   General:  A&OX3 with no signs of acute distress. She appears well-developed and well-nourished. No distress.  Neck: Normal range of motion.  Pulmonary/Chest: Effort normal. No respiratory distress.  Musculoskeletal: Normal range of motion.  Neurological: She is alert and oriented to person, place, and time.  Skin: Skin is warm and dry.   A: Positive Pregnancy Test  P: Explained benefits of taking prenatal vitamins w/folic acid early in pregnancy. Begin prenatal care. Reviewed warning signs of pregnancy.  Rhea PinkLori A Clemmons, CNM S:  Joy Cobiarin Rogus is a 24 y.o. G1P0 at 6484w4d wks here for confirmation of pregnancy.  Patient's Patient's last menstrual period was 10/30/2014.Marland Kitchen.  Denies any vaginal bleeding or abdominal pain.Val [place].  O:  Past Medical History  Diagnosis Date  . Genital herpes   . BV (bacterial vaginosis)     No family history on file.   Filed Vitals:   12/08/14 1800  BP: 119/55  Pulse: 70  Temp: 98.5 F (36.9 C)  Resp: 18   General:  A&OX3 with no signs of acute distress. She appears well-developed and well-nourished. No distress.  Neck: Normal range of motion.  Pulmonary/Chest: Effort normal. No respiratory distress.  Musculoskeletal: Normal range of motion.  Neurological: She is alert and oriented to person, place, and time.  Skin: Skin is warm and dry.   A: Positive Pregnancy Test  P: Explained benefits of taking prenatal vitamins w/folic acid early in pregnancy. Begin prenatal care. Reviewed warning signs of pregnancy.  Rhea PinkLori A Clemmons, CNM

## 2014-12-08 NOTE — MAU Note (Signed)
Missed period, thinks she is pregnant.  +HPT on 11/30.  Here today because she is really nauseous.

## 2014-12-26 LAB — OB RESULTS CONSOLE HIV ANTIBODY (ROUTINE TESTING): HIV: NONREACTIVE

## 2014-12-26 LAB — OB RESULTS CONSOLE GC/CHLAMYDIA
Chlamydia: NEGATIVE
Gonorrhea: NEGATIVE

## 2014-12-26 LAB — OB RESULTS CONSOLE HEPATITIS B SURFACE ANTIGEN: HEP B S AG: NEGATIVE

## 2014-12-26 LAB — OB RESULTS CONSOLE RUBELLA ANTIBODY, IGM: Rubella: IMMUNE

## 2014-12-26 LAB — OB RESULTS CONSOLE ABO/RH: RH TYPE: POSITIVE

## 2014-12-26 LAB — OB RESULTS CONSOLE RPR: RPR: NONREACTIVE

## 2014-12-26 LAB — OB RESULTS CONSOLE ANTIBODY SCREEN: ANTIBODY SCREEN: NEGATIVE

## 2015-01-06 NOTE — L&D Delivery Note (Signed)
Delivery Note At 2:45 PM a viable female was delivered via Vaginal, Spontaneous Delivery (Presentation: ; Occiput Anterior).  APGAR: 9, 9; weight  pending Placenta status: delivered Cord: 3 vessels with the following complications: None.    Anesthesia: Epidural  Episiotomy:  none Lacerations: 2nd degree Suture Repair: 2.0 vicryl Est. Blood Loss (mL): 300  Mom to postpartum.  Baby to Couplet care / Skin to Skin.  Myna Hidalgo, M 07/27/2015, 3:05 PM

## 2015-04-02 ENCOUNTER — Encounter (HOSPITAL_COMMUNITY): Payer: Self-pay | Admitting: *Deleted

## 2015-04-02 ENCOUNTER — Inpatient Hospital Stay (HOSPITAL_COMMUNITY): Payer: Medicaid Other

## 2015-04-02 ENCOUNTER — Inpatient Hospital Stay (HOSPITAL_COMMUNITY)
Admission: AD | Admit: 2015-04-02 | Discharge: 2015-04-02 | Disposition: A | Discharge status: Home / Self Care | Payer: Medicaid Other | Source: Ambulatory Visit | Attending: Obstetrics & Gynecology | Admitting: Obstetrics & Gynecology

## 2015-04-02 DIAGNOSIS — Y990 Civilian activity done for income or pay: Secondary | ICD-10-CM | POA: Diagnosis not present

## 2015-04-02 DIAGNOSIS — O98312 Other infections with a predominantly sexual mode of transmission complicating pregnancy, second trimester: Secondary | ICD-10-CM | POA: Insufficient documentation

## 2015-04-02 DIAGNOSIS — O26892 Other specified pregnancy related conditions, second trimester: Secondary | ICD-10-CM | POA: Diagnosis not present

## 2015-04-02 DIAGNOSIS — W19XXXA Unspecified fall, initial encounter: Secondary | ICD-10-CM | POA: Diagnosis not present

## 2015-04-02 DIAGNOSIS — O99332 Smoking (tobacco) complicating pregnancy, second trimester: Secondary | ICD-10-CM | POA: Diagnosis not present

## 2015-04-02 DIAGNOSIS — Z3A22 22 weeks gestation of pregnancy: Secondary | ICD-10-CM | POA: Diagnosis not present

## 2015-04-02 DIAGNOSIS — A6000 Herpesviral infection of urogenital system, unspecified: Secondary | ICD-10-CM | POA: Insufficient documentation

## 2015-04-02 DIAGNOSIS — Y9389 Activity, other specified: Secondary | ICD-10-CM | POA: Insufficient documentation

## 2015-04-02 DIAGNOSIS — F17200 Nicotine dependence, unspecified, uncomplicated: Secondary | ICD-10-CM | POA: Diagnosis not present

## 2015-04-02 DIAGNOSIS — Y9289 Other specified places as the place of occurrence of the external cause: Secondary | ICD-10-CM | POA: Insufficient documentation

## 2015-04-02 DIAGNOSIS — O9A219 Injury, poisoning and certain other consequences of external causes complicating pregnancy, unspecified trimester: Secondary | ICD-10-CM

## 2015-04-02 MED ORDER — PROGESTERONE MICRONIZED 200 MG PO CAPS: 200.0000 mg | ORAL_CAPSULE | Freq: Every day | ORAL | Status: DC

## 2015-04-02 NOTE — Progress Notes (Signed)
Manfred ArchV. Latham CNM notified of pt's admission and status. Aware of fall, no LOF or bleeding, some low back pain, ? FM since fall. Will see pt

## 2015-04-02 NOTE — Progress Notes (Signed)
Manfred ArchV. Latham CNM in to see pt and d/ced EFM

## 2015-04-02 NOTE — MAU Provider Note (Signed)
  History   25 yo G1P0 at 22 weeks presented unannounced s/p falling off a cart at work, landing on buttocks.  Did not strike abdomen, but feeling some lower "pulling" in abdomen.  Denies leaking or bleeding, reports +FM.    Had anatomy US on 03/18/15, with normal findings, cervix 3.8 cm, anterior placenta.  O+ type  Patient Active Problem List   Diagnosis Date Noted  . Genital herpes 04/02/2015    Chief Complaint  Patient presents with  . Fall   HPI:  As above  OB History    Gravida Para Term Preterm AB TAB SAB Ectopic Multiple Living   1               Past Medical History  Diagnosis Date  . Genital herpes   . BV (bacterial vaginosis)     Past Surgical History  Procedure Laterality Date  . No past surgeries      History reviewed. No pertinent family history.  Social History  Substance Use Topics  . Smoking status: Current Every Day Smoker  . Smokeless tobacco: None  . Alcohol Use: Yes    Allergies: No Known Allergies  Prescriptions prior to admission  Medication Sig Dispense Refill Last Dose  . acetaminophen (TYLENOL) 325 MG tablet Take 650 mg by mouth every 6 (six) hours as needed for moderate pain.   Past Month at Unknown time  . Prenatal Vit-Fe Fumarate-FA (PRENATAL MULTIVITAMIN) TABS tablet Take 1 tablet by mouth daily at 12 noon.   Past Week at Unknown time  . promethazine (PHENERGAN) 25 MG tablet Take 1 tablet (25 mg total) by mouth every 6 (six) hours as needed for nausea or vomiting. (Patient not taking: Reported on 04/02/2015) 30 tablet 0 Not Taking at Unknown time    ROS:  Mild tenderness in buttocks, mild pressure in lower abdomen. Physical Exam   Blood pressure 126/65, pulse 78, temperature 98.3 F (36.8 C), resp. rate 18, height 5\' 8"  (1.727 m), weight 97.07 kg (214 lb), last menstrual period 10/30/2014.    Physical Exam  In NAD Chest clear Heart RRR without murmur Abd gravid, NT, soft to palpation Pelvic--deferred Ext WNL, no obvious  trauma  FHR 160 No UCs on toco  ED Course  Assessment: IUP at 22 weeks S/P maternal fall O+ type   Plan: Limited OB US. If WNL, will d/c home with precautions.   Nigel BridgemanLATHAM, Corinn Stoltzfus CNM, MSN 04/02/2015 2:49 PM   Addendum: Returned from US:  Cervical length 1.9 cm, with dynamic changes.  Breech, anterior placenta, normal fluid.  Placenta WNL.  Recommendation for recheck of cervical length by US in 1 week, and vaginal progesterone 200 mg per vaginal q hs.  Consulted with Dr. Sallye OberKulwa. D/C home on pelvic rest. OOW until re-evaluation in office in 1 week. [facility]. Consider betamethasone at 23 6/7 weeks. Rx Prometrium 200 mg per vagina q hs--start today. S/s PTL reviewed with patient.  Ray ChurchVicki Valjean Ruppel,CNM 04/02/15 3p

## 2015-04-02 NOTE — Progress Notes (Signed)
Manfred ArchV. Latham CNM notified that pt's u/s report is back. CNM aware and will discuss with Dr Sallye OberKulwa

## 2015-04-02 NOTE — MAU Note (Signed)
About 1030 was sitting on cart with wheels. Repositioned myself and cart went out from under me and fell on butt. Denies LOF or bleeding. Unsure if has felt FM or not since then. Some lower back pain that has gotten some better.

## 2015-04-02 NOTE — Progress Notes (Signed)
Joy [name] cNM in earlier to discuss d/c plan. Written and verbal d/c instructions given and understanding voiced.

## 2015-04-02 NOTE — Discharge Instructions (Signed)
OUT OF WORK UNTIL NEXT VISIT IN 1 WEEK AT [facility]. NO SEX, OR ANYTHING IN THE VAGINA.  NO ORGASM! PLACE PROGESTERONE CAPSULE IN VAGINA EVERY NIGHT AT BEDTIME. CALL FOR ANY QUESTIONS OR CONCERNS.  Cervical Insufficiency Cervical insufficiency is when the cervix is weak and starts to open (dilate) and thin (efface) before the pregnancy is at term and without labor starting. This is also called incompetent cervix. It can happen in the second or third trimester when the fetus starts putting pressure on the cervix. Cervical insufficiency can lead to a miscarriage, preterm premature rupture of the membranes (PPROM), or having the baby early (preterm birth).  RISK FACTORS You may be more likely to develop cervical insufficiency if:  You have a shorter cervix than normal.  Damage or injury occurred to your cervix from a past pregnancy or surgery.  You were born with a cervical defect.  You have had a procedure done on the cervix, such as cervical biopsy.  You have a history of cervical insufficiency.  You have a history of PPROM.  You have ended several past pregnancies through abortion.  You were exposed to the drug diethylstilbestrol (DES). SYMPTOMS Often times, women do not have any symptoms. Other times, women may only have mild symptoms that often start between week 14 through 20. The symptoms may last several days or weeks. These symptoms include:  Light spotting or bleeding from the vagina.  Pelvic pressure.  A change in vaginal discharge, such as discharge that changes from clear, white, or light yellow to pink or tan.  Back pain.  Abdominal pain or cramping. DIAGNOSIS Cervical insufficiency cannot be diagnosed before you become pregnant. Once you are pregnant, your health care provider will ask about your medical history and if you have had any problems in past pregnancies. Tell your health care provider about any procedures performed on your cervix or if you have a history of  miscarriages or cervical insufficiency. If your health care provider thinks you are at high risk for cervical insufficiency or show signs of cervical insufficiency, he or she may:  Perform a pelvic exam. This will check for:  The presence of the membranes (amniotic sac) coming out of the cervix.  Cervical abnormalities.  Cervical injuries.  The presence of contractions.  Perform an ultrasonography (commonly called ultrasound) to measure the length and thickness of the cervix. TREATMENT If you have been diagnosed with cervical insufficiency, your health care provider may recommend:  Limiting physical activity.  Bed rest at home or in the hospital.  Pelvic rest, which means no sexual intercourse or placing anything in the vagina.  Cerclage to sew the cervix closed and prevent it from opening too early. The stitches (sutures) are removed between weeks 36 and 38 to avoid problems during labor. Cerclage may be recommended during pregnancy if you have had a history of miscarriages or preterm births without a known cause. It may also be recommended if you have a short cervix that was identified by ultrasound or if your health care provider has found that your cervix has dilated before 24 weeks of pregnancy. Limiting physical activity and bed rest may or may not help prevent a preterm birth. WHEN SHOULD YOU SEEK IMMEDIATE MEDICAL CARE?  Seek immediate medical care if you show any symptoms of cervical insufficiency. You will need to go to the hospital to get checked immediately.   This information is not intended to replace advice given to you by your health care provider. Make sure  you discuss any questions you have with your health care provider.   Document Released: 12/22/2004 Document Revised: 01/12/2014 Document Reviewed: 02/29/2012 Elsevier Interactive Patient Education [employer] Elsevier Inc.

## 2015-05-09 MED FILL — BETAMETHASONE AC-SP 6 MG/ML: 6 (3-3) | 2 days supply | Qty: 5 | Fill #0

## 2015-05-11 ENCOUNTER — Inpatient Hospital Stay (HOSPITAL_COMMUNITY)
Admission: AD | Admit: 2015-05-11 | Discharge: 2015-05-11 | Disposition: A | Discharge status: Home / Self Care | Payer: Medicaid Other | Source: Ambulatory Visit | Attending: Obstetrics & Gynecology | Admitting: Obstetrics & Gynecology

## 2015-05-11 DIAGNOSIS — Z3A27 27 weeks gestation of pregnancy: Secondary | ICD-10-CM | POA: Insufficient documentation

## 2015-05-11 DIAGNOSIS — O26872 Cervical shortening, second trimester: Secondary | ICD-10-CM | POA: Insufficient documentation

## 2015-05-11 MED ORDER — BETAMETHASONE SOD PHOS & ACET 6 (3-3) MG/ML IJ SUSP
12.0000 mg | Freq: Once | INTRAMUSCULAR | Status: AC
  Administered 2015-05-11: 12 mg via INTRAMUSCULAR

## 2015-05-11 NOTE — MAU Note (Signed)
Left voice message for Dr. Sallye OberKulwa to return my call.

## 2015-05-11 NOTE — MAU Note (Signed)
Here for second betamethasone brought medication with her.

## 2015-05-19 ENCOUNTER — Inpatient Hospital Stay (HOSPITAL_COMMUNITY)
Admission: AD | Admit: 2015-05-19 | Discharge: 2015-05-19 | Disposition: A | Discharge status: Home / Self Care | Payer: Medicaid Other | Source: Ambulatory Visit | Attending: Obstetrics and Gynecology | Admitting: Obstetrics and Gynecology

## 2015-05-19 ENCOUNTER — Encounter (HOSPITAL_COMMUNITY): Payer: Self-pay | Admitting: *Deleted

## 2015-05-19 DIAGNOSIS — Z87891 Personal history of nicotine dependence: Secondary | ICD-10-CM | POA: Diagnosis not present

## 2015-05-19 DIAGNOSIS — N898 Other specified noninflammatory disorders of vagina: Secondary | ICD-10-CM | POA: Diagnosis not present

## 2015-05-19 DIAGNOSIS — O26893 Other specified pregnancy related conditions, third trimester: Secondary | ICD-10-CM | POA: Insufficient documentation

## 2015-05-19 DIAGNOSIS — Z3A28 28 weeks gestation of pregnancy: Secondary | ICD-10-CM | POA: Insufficient documentation

## 2015-05-19 LAB — AMNISURE RUPTURE OF MEMBRANE (ROM) NOT AT ARMC: AMNISURE: NEGATIVE

## 2015-05-19 NOTE — MAU Note (Signed)
Told low fluid in office on Thursday. noticed underwear and vaginal area wet today

## 2015-05-19 NOTE — MAU Provider Note (Signed)
  History     CSN: [id number]  Arrival date and time: 05/19/15 1552   First Provider Initiated Contact with Patient 05/19/15 1703      Chief Complaint  Patient presents with  . Rupture of Membranes   HPI Ms. Polly Cobiarin Floyd is a 25 y.o. G1P0 at 1237w5d who presents to MAU today with complaint of possible LOF. The patient denies abdominal pain, vaginal bleeding or contractions. She reports good fetal movement.    OB History    Gravida Para Term Preterm AB TAB SAB Ectopic Multiple Living   1               Past Medical History  Diagnosis Date  . Genital herpes   . BV (bacterial vaginosis)     Past Surgical History  Procedure Laterality Date  . No past surgeries      No family history on file.  Social History  Substance Use Topics  . Smoking status: Former Games developermoker  . Smokeless tobacco: None  . Alcohol Use: No    Allergies: No Known Allergies  No prescriptions prior to admission    Review of Systems  Gastrointestinal: Negative for abdominal pain.  Genitourinary:       Neg - vaginal bleeding + LOF   Physical Exam   Blood pressure 118/67, pulse 89, temperature 98.9 F (37.2 C), temperature source Oral, resp. rate 18, height 5\' 7"  (1.702 m), weight 244 lb (110.678 kg), last menstrual period 10/30/2014.  Physical Exam  Nursing note and vitals reviewed. Constitutional: She is oriented to person, place, and time. She appears well-developed and well-nourished. No distress.  HENT:  Head: Normocephalic and atraumatic.  Cardiovascular: Normal rate.   Respiratory: Effort normal.  GI: Soft. She exhibits no distension and no mass. There is no tenderness. There is no rebound and no guarding.  Neurological: She is alert and oriented to person, place, and time.  Skin: Skin is warm and dry. No erythema.  Psychiatric: She has a normal mood and affect.    Results for orders placed or performed during the hospital encounter of 05/19/15 (from the past 24 hour(s))  [facility]  [facility]     Status: None   Collection Time: 05/19/15  4:21 PM  Result Value Ref Range   Amnisure ROM NEGATIVE     MAU Course  Procedures None  MDM Amnisure - negative Received call from Nigel BridgemanVicki Latham, CNM requesting medical screen of the patient prior to discharge.  Medical screening exam complete. Patient stable for discharge at this time. Membranes intact.  Assessment and Plan  A: SIUP at 8237w5d Vaginal discharge in pregnancy  P: Medical screening exam complete Patient discharged by RN in stable condition Preterm labor precautions discussed Patient advised to follow-up with [facility] as scheduled for routine prenatal care or sooner PRN Patient may return to MAU as needed   Marny LowensteinJulie N Velvia Mehrer, PA-C  05/19/2015, 5:12 PM

## 2015-06-27 MED FILL — Betamethasone Sod Phosphate & Acetate Inj Susp 6 (3-3) MG/ML: INTRAMUSCULAR | Qty: 2 | Status: AC

## 2015-07-16 LAB — OB RESULTS CONSOLE GBS: GBS: POSITIVE

## 2015-07-27 ENCOUNTER — Inpatient Hospital Stay (HOSPITAL_COMMUNITY): Payer: Medicaid Other | Admitting: Anesthesiology

## 2015-07-27 ENCOUNTER — Encounter (HOSPITAL_COMMUNITY): Payer: Self-pay

## 2015-07-27 ENCOUNTER — Inpatient Hospital Stay (HOSPITAL_COMMUNITY): Payer: Medicaid Other

## 2015-07-27 ENCOUNTER — Inpatient Hospital Stay (HOSPITAL_COMMUNITY)
Admission: AD | Admit: 2015-07-27 | Discharge: 2015-07-29 | DRG: 774 | Disposition: A | Discharge status: Home / Self Care | Payer: Medicaid Other | Source: Ambulatory Visit | Attending: Obstetrics and Gynecology | Admitting: Obstetrics and Gynecology

## 2015-07-27 DIAGNOSIS — B009 Herpesviral infection, unspecified: Secondary | ICD-10-CM | POA: Diagnosis present

## 2015-07-27 DIAGNOSIS — O4290 Premature rupture of membranes, unspecified as to length of time between rupture and onset of labor, unspecified weeks of gestation: Secondary | ICD-10-CM

## 2015-07-27 DIAGNOSIS — O99324 Drug use complicating childbirth: Secondary | ICD-10-CM | POA: Diagnosis present

## 2015-07-27 DIAGNOSIS — O9902 Anemia complicating childbirth: Secondary | ICD-10-CM | POA: Diagnosis present

## 2015-07-27 DIAGNOSIS — Z3A38 38 weeks gestation of pregnancy: Secondary | ICD-10-CM

## 2015-07-27 DIAGNOSIS — O99214 Obesity complicating childbirth: Secondary | ICD-10-CM | POA: Diagnosis present

## 2015-07-27 DIAGNOSIS — F1290 Cannabis use, unspecified, uncomplicated: Secondary | ICD-10-CM | POA: Diagnosis present

## 2015-07-27 DIAGNOSIS — Z87891 Personal history of nicotine dependence: Secondary | ICD-10-CM | POA: Diagnosis not present

## 2015-07-27 DIAGNOSIS — Z6841 Body Mass Index (BMI) 40.0 and over, adult: Secondary | ICD-10-CM

## 2015-07-27 DIAGNOSIS — O99824 Streptococcus B carrier state complicating childbirth: Principal | ICD-10-CM | POA: Diagnosis present

## 2015-07-27 DIAGNOSIS — O9852 Other viral diseases complicating childbirth: Secondary | ICD-10-CM | POA: Diagnosis present

## 2015-07-27 DIAGNOSIS — O99820 Streptococcus B carrier state complicating pregnancy: Secondary | ICD-10-CM

## 2015-07-27 LAB — COMPREHENSIVE METABOLIC PANEL
ALK PHOS: 129 U/L — AB (ref 38–126)
ALT: 15 U/L (ref 14–54)
AST: 21 U/L (ref 15–41)
Albumin: 3.2 g/dL — ABNORMAL LOW (ref 3.5–5.0)
Anion gap: 9 (ref 5–15)
BUN: 9 mg/dL (ref 6–20)
CALCIUM: 9.4 mg/dL (ref 8.9–10.3)
CHLORIDE: 103 mmol/L (ref 101–111)
CO2: 21 mmol/L — ABNORMAL LOW (ref 22–32)
CREATININE: 0.65 mg/dL (ref 0.44–1.00)
Glucose, Bld: 83 mg/dL (ref 65–99)
Potassium: 4.1 mmol/L (ref 3.5–5.1)
Sodium: 133 mmol/L — ABNORMAL LOW (ref 135–145)
TOTAL PROTEIN: 6.4 g/dL — AB (ref 6.5–8.1)
Total Bilirubin: 0.4 mg/dL (ref 0.3–1.2)

## 2015-07-27 LAB — TYPE AND SCREEN
ABO/RH(D): O POS
Antibody Screen: NEGATIVE

## 2015-07-27 LAB — CBC
HCT: 33.3 % — ABNORMAL LOW (ref 36.0–46.0)
HCT: 33.3 % — ABNORMAL LOW (ref 36.0–46.0)
Hemoglobin: 11.7 g/dL — ABNORMAL LOW (ref 12.0–15.0)
Hemoglobin: 11.5 g/dL — ABNORMAL LOW (ref 12.0–15.0)
MCH: 31.3 pg (ref 26.0–34.0)
MCH: 31.1 pg (ref 26.0–34.0)
MCHC: 35.1 g/dL (ref 30.0–36.0)
MCHC: 34.5 g/dL (ref 30.0–36.0)
MCV: 89 fL (ref 78.0–100.0)
MCV: 90 fL (ref 78.0–100.0)
PLATELETS: 251 10*3/uL (ref 150–400)
PLATELETS: 241 10*3/uL (ref 150–400)
RBC: 3.74 MIL/uL — AB (ref 3.87–5.11)
RBC: 3.7 MIL/uL — ABNORMAL LOW (ref 3.87–5.11)
RDW: 13.7 % (ref 11.5–15.5)
RDW: 13.6 % (ref 11.5–15.5)
WBC: 11.2 10*3/uL — ABNORMAL HIGH (ref 4.0–10.5)
WBC: 14 10*3/uL — ABNORMAL HIGH (ref 4.0–10.5)

## 2015-07-27 LAB — URIC ACID: URIC ACID, SERUM: 4 mg/dL (ref 2.3–6.6)

## 2015-07-27 LAB — RPR: RPR: NONREACTIVE

## 2015-07-27 LAB — LACTATE DEHYDROGENASE: LDH: 132 U/L (ref 98–192)

## 2015-07-27 LAB — ABO/RH: ABO/RH(D): O POS

## 2015-07-27 LAB — AMNISURE RUPTURE OF MEMBRANE (ROM) NOT AT ARMC: Amnisure ROM: POSITIVE

## 2015-07-27 MED ORDER — ONDANSETRON HCL 4 MG PO TABS: 4.0000 mg | ORAL_TABLET | ORAL | Status: DC | PRN

## 2015-07-27 MED ORDER — FLEET ENEMA 7-19 GM/118ML RE ENEM: 1.0000 | ENEMA | RECTAL | Status: DC | PRN

## 2015-07-27 MED ORDER — WITCH HAZEL-GLYCERIN EX PADS: 1.0000 "application " | MEDICATED_PAD | CUTANEOUS | Status: DC | PRN

## 2015-07-27 MED ORDER — PHENYLEPHRINE 40 MCG/ML (10ML) SYRINGE FOR IV PUSH (FOR BLOOD PRESSURE SUPPORT)
80.0000 ug | PREFILLED_SYRINGE | INTRAVENOUS | Status: DC | PRN
  Administered 2015-07-27 (×2): 80 ug via INTRAVENOUS
  Filled 2015-07-27: qty 5

## 2015-07-27 MED ORDER — EPHEDRINE 5 MG/ML INJ
10.0000 mg | INTRAVENOUS | Status: DC | PRN
  Administered 2015-07-27: 10 mg via INTRAVENOUS
  Filled 2015-07-27: qty 2

## 2015-07-27 MED ORDER — LABETALOL HCL 5 MG/ML IV SOLN
20.0000 mg | INTRAVENOUS | Status: DC | PRN
Start: 2015-07-27 — End: 2015-07-27

## 2015-07-27 MED ORDER — SIMETHICONE 80 MG PO CHEW: 80.0000 mg | CHEWABLE_TABLET | ORAL | Status: DC | PRN

## 2015-07-27 MED ORDER — OXYTOCIN BOLUS FROM INFUSION
500.0000 mL | INTRAVENOUS | Status: DC
  Administered 2015-07-27: 500 mL via INTRAVENOUS

## 2015-07-27 MED ORDER — SENNOSIDES-DOCUSATE SODIUM 8.6-50 MG PO TABS
2.0000 | ORAL_TABLET | ORAL | Status: DC
  Administered 2015-07-27 – 2015-07-28 (×2): 2 via ORAL
  Filled 2015-07-27 (×2): qty 2

## 2015-07-27 MED ORDER — LIDOCAINE HCL (PF) 1 % IJ SOLN
INTRAMUSCULAR | Status: DC | PRN
  Administered 2015-07-27 (×2): 4 mL via EPIDURAL

## 2015-07-27 MED ORDER — EPHEDRINE 5 MG/ML INJ: 10.0000 mg | INTRAVENOUS | Status: DC | PRN

## 2015-07-27 MED ORDER — DIBUCAINE 1 % RE OINT: 1.0000 "application " | TOPICAL_OINTMENT | RECTAL | Status: DC | PRN

## 2015-07-27 MED ORDER — ONDANSETRON HCL 4 MG/2ML IJ SOLN: 4.0000 mg | Freq: Four times a day (QID) | INTRAMUSCULAR | Status: DC | PRN

## 2015-07-27 MED ORDER — TERBUTALINE SULFATE 1 MG/ML IJ SOLN
0.2500 mg | Freq: Once | INTRAMUSCULAR | Status: DC | PRN
  Filled 2015-07-27: qty 1

## 2015-07-27 MED ORDER — OXYCODONE-ACETAMINOPHEN 5-325 MG PO TABS: 2.0000 | ORAL_TABLET | ORAL | Status: DC | PRN

## 2015-07-27 MED ORDER — TETANUS-DIPHTH-ACELL PERTUSSIS 5-2.5-18.5 LF-MCG/0.5 IM SUSP: 0.5000 mL | Freq: Once | INTRAMUSCULAR | Status: DC

## 2015-07-27 MED ORDER — DIPHENHYDRAMINE HCL 25 MG PO CAPS: 25.0000 mg | ORAL_CAPSULE | Freq: Four times a day (QID) | ORAL | Status: DC | PRN

## 2015-07-27 MED ORDER — DEXTROSE 5 % IV SOLN
2.5000 10*6.[IU] | INTRAVENOUS | Status: DC
  Administered 2015-07-27 (×2): 2.5 10*6.[IU] via INTRAVENOUS
  Filled 2015-07-27 (×5): qty 2.5

## 2015-07-27 MED ORDER — EPHEDRINE 5 MG/ML INJ
10.0000 mg | INTRAVENOUS | Status: DC | PRN
  Filled 2015-07-27: qty 4
  Filled 2015-07-27: qty 2

## 2015-07-27 MED ORDER — ONDANSETRON HCL 4 MG/2ML IJ SOLN: 4.0000 mg | INTRAMUSCULAR | Status: DC | PRN

## 2015-07-27 MED ORDER — COCONUT OIL OIL
1.0000 | TOPICAL_OIL | Status: DC | PRN
Start: 2015-07-27 — End: 2015-07-29
  Filled 2015-07-27: qty 120

## 2015-07-27 MED ORDER — PHENYLEPHRINE 40 MCG/ML (10ML) SYRINGE FOR IV PUSH (FOR BLOOD PRESSURE SUPPORT): 80.0000 ug | PREFILLED_SYRINGE | INTRAVENOUS | Status: DC | PRN

## 2015-07-27 MED ORDER — ACETAMINOPHEN 325 MG PO TABS: 650.0000 mg | ORAL_TABLET | ORAL | Status: DC | PRN

## 2015-07-27 MED ORDER — ZOLPIDEM TARTRATE 5 MG PO TABS: 5.0000 mg | ORAL_TABLET | Freq: Every evening | ORAL | Status: DC | PRN

## 2015-07-27 MED ORDER — HYDRALAZINE HCL 20 MG/ML IJ SOLN: 10.0000 mg | Freq: Once | INTRAMUSCULAR | Status: DC | PRN

## 2015-07-27 MED ORDER — LACTATED RINGERS IV SOLN: 500.0000 mL | INTRAVENOUS | Status: DC | PRN

## 2015-07-27 MED ORDER — FENTANYL 2.5 MCG/ML BUPIVACAINE 1/10 % EPIDURAL INFUSION (WH - ANES)
14.0000 mL/h | INTRAMUSCULAR | Status: DC | PRN
Start: 2015-07-27 — End: 2015-07-27
  Administered 2015-07-27: 14 mL/h via EPIDURAL
  Filled 2015-07-27: qty 125

## 2015-07-27 MED ORDER — LACTATED RINGERS IV SOLN
INTRAVENOUS | Status: DC
  Administered 2015-07-27 (×2): via INTRAVENOUS

## 2015-07-27 MED ORDER — NALBUPHINE HCL 10 MG/ML IJ SOLN
10.0000 mg | INTRAMUSCULAR | Status: DC | PRN
  Administered 2015-07-27 (×2): 10 mg via INTRAVENOUS
  Filled 2015-07-27 (×2): qty 1

## 2015-07-27 MED ORDER — SOD CITRATE-CITRIC ACID 500-334 MG/5ML PO SOLN: 30.0000 mL | ORAL | Status: DC | PRN

## 2015-07-27 MED ORDER — LACTATED RINGERS IV SOLN: 500.0000 mL | Freq: Once | INTRAVENOUS | Status: DC

## 2015-07-27 MED ORDER — PRENATAL MULTIVITAMIN CH
1.0000 | ORAL_TABLET | Freq: Every day | ORAL | Status: DC
  Administered 2015-07-28 – 2015-07-29 (×2): 1 via ORAL
  Filled 2015-07-27 (×2): qty 1

## 2015-07-27 MED ORDER — PHENYLEPHRINE 40 MCG/ML (10ML) SYRINGE FOR IV PUSH (FOR BLOOD PRESSURE SUPPORT)
80.0000 ug | PREFILLED_SYRINGE | INTRAVENOUS | Status: DC | PRN
  Filled 2015-07-27: qty 10
  Filled 2015-07-27: qty 5
  Filled 2015-07-27: qty 10

## 2015-07-27 MED ORDER — PENICILLIN G POTASSIUM 5000000 UNITS IJ SOLR
5.0000 10*6.[IU] | Freq: Once | INTRAVENOUS | Status: AC
  Administered 2015-07-27: 5 10*6.[IU] via INTRAVENOUS
  Filled 2015-07-27: qty 5

## 2015-07-27 MED ORDER — OXYTOCIN 40 UNITS IN LACTATED RINGERS INFUSION - SIMPLE MED
1.0000 m[IU]/min | INTRAVENOUS | Status: DC
Start: 2015-07-27 — End: 2015-07-27
  Administered 2015-07-27: 2 m[IU]/min via INTRAVENOUS
  Filled 2015-07-27: qty 1000

## 2015-07-27 MED ORDER — OXYCODONE-ACETAMINOPHEN 5-325 MG PO TABS: 1.0000 | ORAL_TABLET | ORAL | Status: DC | PRN

## 2015-07-27 MED ORDER — OXYTOCIN 40 UNITS IN LACTATED RINGERS INFUSION - SIMPLE MED
2.5000 [IU]/h | INTRAVENOUS | Status: DC
Start: 2015-07-27 — End: 2015-07-27

## 2015-07-27 MED ORDER — LIDOCAINE HCL (PF) 1 % IJ SOLN
30.0000 mL | INTRAMUSCULAR | Status: DC | PRN
  Filled 2015-07-27: qty 30

## 2015-07-27 MED ORDER — IBUPROFEN 600 MG PO TABS
600.0000 mg | ORAL_TABLET | Freq: Four times a day (QID) | ORAL | Status: DC
  Administered 2015-07-27 – 2015-07-29 (×8): 600 mg via ORAL
  Filled 2015-07-27 (×8): qty 1

## 2015-07-27 MED ORDER — BENZOCAINE-MENTHOL 20-0.5 % EX AERO
1.0000 "application " | INHALATION_SPRAY | CUTANEOUS | Status: DC | PRN
  Filled 2015-07-27: qty 56

## 2015-07-27 MED ORDER — DIPHENHYDRAMINE HCL 50 MG/ML IJ SOLN: 12.5000 mg | INTRAMUSCULAR | Status: DC | PRN

## 2015-07-27 NOTE — Progress Notes (Signed)
OB PN:  S: Pt feeling regular contractions, rates her pain 7/10  O: BP 134/80 mmHg  Pulse 78  Temp(Src) 98.7 F (37.1 C) (Oral)  Resp 18  Ht 5\' 7"  (1.702 m)  Wt 126.554 kg (279 lb)  BMI 43.69 kg/m2  LMP 10/30/2014  FHT: 130bpm, moderate variablity, + accels, no decels Toco: q53min SVE: 4/100/-2 per RN  A/P: 25 y.o. G1P0 @ [redacted]w[redacted]d for SROM 1. FWB: Cat. I 2. Labor: due to slow progression will start Pit per protocol Pain: IV Stadol or epidural upon request GBS: positive, continue PCN per protocol  Myna Hidalgo, DO [phone] (pager) [phone] (office)

## 2015-07-27 NOTE — MAU Note (Signed)
Had a gush of fld about ago and still running down my legs. Clear to whitish color. Did not wear  A pad

## 2015-07-27 NOTE — Anesthesia Pain Management Evaluation Note (Signed)
  CRNA Pain Management Visit Note  Patient: Joy [name], 25 y.o., female  "Hello I am a member of the anesthesia team at [facility]. We have an anesthesia team available at all times to provide care throughout the hospital, including epidural management and anesthesia for C-section. I don't know your plan for the delivery whether it a natural birth, water birth, IV sedation, nitrous supplementation, doula or epidural, but we want to meet your pain goals."   1.Was your pain managed to your expectations on prior hospitalizations?   No prior hospitalizations  2.What is your expectation for pain management during this hospitalization?     IV pain meds  3.How can we help you reach that goal? unsure  Record the patient's initial score and the patient's pain goal.   Pain: 5  Pain Goal: 8 [facility] wants you to be able to say your pain was always managed very well.  Cephus Shelling 07/27/2015

## 2015-07-27 NOTE — Anesthesia Procedure Notes (Signed)
Epidural Patient location during procedure: OB Start time: 07/27/2015 11:48 AM End time: 07/27/2015 11:54 AM  Staffing Anesthesiologist: Shona Simpson D Performed by: anesthesiologist   Preanesthetic Checklist Completed: patient identified, site marked, surgical consent, pre-op evaluation, timeout performed, IV checked, risks and benefits discussed and monitors and equipment checked  Epidural Patient position: sitting Prep: ChloraPrep Patient monitoring: heart rate, continuous pulse ox and blood pressure Approach: midline Location: L3-L4 Injection technique: LOR saline  Needle:  Needle type: Tuohy  Needle gauge: 17 G Needle length: 9 cm Catheter type: closed end flexible Catheter size: 20 Guage Test dose: negative and 1.5% lidocaine  Assessment Events: blood not aspirated, injection not painful, no injection resistance and no paresthesia  Additional Notes LOR @ 8.5  Patient identified. Risks/Benefits/Options discussed with patient including but not limited to bleeding, infection, nerve damage, paralysis, failed block, incomplete pain control, headache, blood pressure changes, nausea, vomiting, reactions to medications, itching and postpartum back pain. Confirmed with bedside nurse the patient's most recent platelet count. Confirmed with patient that they are not currently taking any anticoagulation, have any bleeding history or any family history of bleeding disorders. Patient expressed understanding and wished to proceed. All questions were answered. Sterile technique was used throughout the entire procedure. Please see nursing notes for vital signs. Test dose was given through epidural catheter and negative prior to continuing to dose epidural or start infusion. Warning signs of high block given to the patient including shortness of breath, tingling/numbness in hands, complete motor block, or any concerning symptoms with instructions to call for help. Patient was given instructions  on fall risk and not to get out of bed. All questions and concerns addressed with instructions to call with any issues or inadequate analgesia.    Reason for block:procedure for pain

## 2015-07-27 NOTE — Progress Notes (Signed)
Subjective: Postpartum Day 1: Vaginal delivery, 2nd degree laceration Patient up ad lib, reports no syncope or dizziness. +Flatus. No BM. Feeding: Breast Contraceptive plan: None  Objective: Vital signs in last 24 hours: Temp:  [97.9 F (36.6 C)-99.4 F (37.4 C)] 98.2 F (36.8 C) (07/22 1830) Pulse Rate:  [63-121] 105 (07/22 1830) Resp:  [16-20] 18 (07/22 1830) BP: (83-156)/(33-89) 137/64 mmHg (07/22 1830) SpO2:  [99 %-100 %] 99 % (07/22 1830) Weight:  [126.554 kg (279 lb)] 126.554 kg (279 lb) (07/22 0344)  Physical Exam:  General: alert, cooperative and no distress Lochia: appropriate Uterine Fundus: firm Perineum: healing well DVT Evaluation: No evidence of DVT seen on physical exam. Negative Homan's sign. No cords or calf tenderness.    CBC Latest Ref Rng 07/27/2015 07/27/2015 06/26/2009  WBC 4.0 - 10.5 K/uL 14.0(H) 11.2(H) -  Hemoglobin 12.0 - 15.0 g/dL 11.5(L) 11.7(L) 13.9  Hematocrit 36.0 - 46.0 % 33.3(L) 33.3(L) 41.0  Platelets 150 - 400 K/uL 241 251 -     Assessment/Plan: Status post vaginal delivery day 1. Normal involution Continue current care. Plan for discharge tomorrow    Joy [name] 07/28/15, 06:55 AM

## 2015-07-27 NOTE — Anesthesia Preprocedure Evaluation (Addendum)
Anesthesia Evaluation  Patient identified by MRN, date of birth, ID band Patient awake    Reviewed: Allergy & Precautions, NPO status , Patient's Chart, lab work & pertinent test results  Airway Mallampati: III       Dental  (+) Teeth Intact   Pulmonary former smoker,    breath sounds clear to auscultation       Cardiovascular negative cardio ROS   Rhythm:Regular Rate:Normal     Neuro/Psych negative neurological ROS  negative psych ROS   GI/Hepatic negative GI ROS, Neg liver ROS,   Endo/Other  negative endocrine ROS  Renal/GU negative Renal ROS  negative genitourinary   Musculoskeletal negative musculoskeletal ROS (+)   Abdominal   Peds negative pediatric ROS (+)  Hematology negative hematology ROS (+)   Anesthesia Other Findings   Reproductive/Obstetrics (+) Pregnancy                             Lab Results  Component Value Date   WBC 14.0* 07/27/2015   HGB 11.5* 07/27/2015   HCT 33.3* 07/27/2015   MCV 90.0 07/27/2015   PLT 241 07/27/2015   No results found for: INR, PROTIME   Anesthesia Physical Anesthesia Plan  ASA: III  Anesthesia Plan: Epidural   Post-op Pain Management:    Induction:   Airway Management Planned:   Additional Equipment:   Intra-op Plan:   Post-operative Plan:   Informed Consent: I have reviewed the patients History and Physical, chart, labs and discussed the procedure including the risks, benefits and alternatives for the proposed anesthesia with the patient or authorized representative who has indicated his/her understanding and acceptance.     Plan Discussed with:   Anesthesia Plan Comments:         Anesthesia Quick Evaluation

## 2015-07-27 NOTE — H&P (Signed)
Joy [name] is a 25 y.o. female, G1P0 @ 38.4 wks (as dated by sure LMP, c/w u/s at 19.6 wks) presents w/ ROM since 01:15 today, clear fluid. +FM. Denies recent outbreak/prodrome, HA, visual changes, epigastric pain or difficulty breathing. Cervix was 2 cm at office visit on 07/23/15.  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 Last took yesterday. 2) Short cervical length in pregnancy - Was on vaginal progesterone. Cvx 1.8 cm w/ funneling of internal os on 06/05/15. She received BMZ course at 27+ wks per MFM's recs.  3) GBS positive - NKDA.     Maternal Medical History:  Reason for admission: Rupture of membranes.   Contractions: Onset was less than 1 hour ago.   Frequency: rare.   Duration is approximately 20 seconds.   Perceived severity is mild.    Fetal activity: Perceived fetal activity is normal.   Last perceived fetal movement was within the past hour.    Prenatal complications: No bleeding.   Prenatal Complications - Diabetes: none.    OB History    Gravida Para Term Preterm AB TAB SAB Ectopic Multiple Living   1              Past Medical History  Diagnosis Date  . Genital herpes   . BV (bacterial vaginosis)    Past Surgical History  Procedure Laterality Date  . No past surgeries     Family History: family history is not on file. Social History:  reports that she has quit smoking. She does not have any smokeless tobacco history on file. She reports that she uses illicit drugs (Marijuana). She reports that she does not drink alcohol.   Prenatal Transfer Tool  Maternal Diabetes: No Genetic Screening: Declined Maternal Ultrasounds/Referrals: 07/05/15 @ 35.5 wks, EFW 5lbs 14 oz (2673 g) 48th%tile, cephalic, anterior placenta, AFI 25th%tile Fetal Ultrasounds or other Referrals:  Referred to [facility]  04/02/15: cvx 1.9 cm; 04/09/15: cvx 2.7 cm,  04/24/15: cvx 2.8 cm. RECOMMEND BMZ X 2.  Maternal Substance Abuse:   No Significant Maternal Medications:  Meds include: Other: Valtrex, PNV Significant Maternal Lab Results:  Lab values include: Group B Strep positive Other Comments:  Declined both flu and Tdap  Review of Systems  Constitutional: Negative for fever, chills and malaise/fatigue.  Eyes: Negative for blurred vision, double vision and photophobia.  Respiratory: Negative for shortness of breath.   Cardiovascular: Negative for chest pain and palpitations.  Gastrointestinal: Negative for heartburn and abdominal pain.  Neurological: Negative for headaches.    Dilation: 2 Effacement (%): 70 Station: -3 Exam by:: Academic librarian RNC Blood pressure 147/81, pulse 91, temperature 98.4 F (36.9 C), resp. rate 18, height 5\' 7"  (1.702 m), weight 126.554 kg (279 lb), last menstrual period 10/30/2014. Maternal Exam:  Uterine Assessment: Contraction strength is mild.  Contraction duration is 30 seconds. Contraction frequency is irregular.   Abdomen: Patient reports no abdominal tenderness. Fundal height is CWD.   Estimated fetal weight is 6 lbs.    Introitus: Vulva is negative for lesion.  Normal vagina.  Ferning test: negative.  Nitrazine test: not done. Amniotic fluid character: clear.  Pelvis: adequate for delivery.   Cervix: Cervix evaluated by sterile speculum exam and digital exam.     Fetal Exam Fetal Monitor Review: Mode: fetoscope.   Baseline rate: 135.  Variability: moderate (6-25 bpm).   Pattern: no decelerations, accelerations present and early decelerations.    Fetal State Assessment: Category  I - tracings are normal.     Physical Exam  Constitutional: She appears well-developed and well-nourished. No distress.  HENT:  Head: Normocephalic and atraumatic.  Neck: Normal range of motion. Neck supple.  Cardiovascular: Normal rate and regular rhythm.  Exam reveals no gallop and no friction rub.   No murmur heard. Respiratory: Effort normal and breath sounds normal.  GI:  Soft. She exhibits no distension and no mass. There is no tenderness. There is no rebound and no guarding.  Genitourinary: Vulva exhibits no lesion.  Musculoskeletal: Normal range of motion.  Skin: Skin is warm and dry.    No lesions noted to external genitalia, vagina, cvx or rectum   Unable to clearly decipher presenting part via Leopold's and VE  Prenatal labs: ABO, Rh: O/Positive/-- (12/21 0000) Antibody: Negative (12/21 0000) Rubella: Immune (12/21 0000) RPR: Nonreactive (12/21 0000)  HBsAg: Negative (12/21 0000)  HIV: Non-reactive (12/21 0000)  GBS: Positive (07/11 0000)   Assessment: IUP at 38.4 wks SROM x 4 1/2 hrs; no s/s of infection GBS positive Latent labor Morbid obesity (BMI 43.5) Herpes Simplex Virus   Plan: Admit to [employer] Routine [facility] orders plus preE labs/ PCR due to elevated BP on admission IV antihypertensive for severe range pressures as needed PCN G for GBS prophylaxis per standard dosing  Formal u/s for presentation Pain med/Nitrous/Epidural as desired Expectant management for now Expect progress and SVD  Sherre Scarlet 07/27/2015, 4:30 AM

## 2015-07-28 LAB — CBC
HCT: 30.7 % — ABNORMAL LOW (ref 36.0–46.0)
Hemoglobin: 10.7 g/dL — ABNORMAL LOW (ref 12.0–15.0)
MCH: 31.4 pg (ref 26.0–34.0)
MCHC: 34.9 g/dL (ref 30.0–36.0)
MCV: 90 fL (ref 78.0–100.0)
PLATELETS: 224 10*3/uL (ref 150–400)
RBC: 3.41 MIL/uL — ABNORMAL LOW (ref 3.87–5.11)
RDW: 13.8 % (ref 11.5–15.5)
WBC: 12.3 10*3/uL — AB (ref 4.0–10.5)

## 2015-07-28 NOTE — Lactation Note (Signed)
This note was copied from a baby's chart. Lactation Consultation Note  Patient Name: Joy [name] Today's Date: 07/28/2015 Reason for consult: Initial assessment (LC encouraged mom to call with feeding cues )  Baby is 25 hours old and has been to the breast several times in her life. With latch scores 7's and 9 's .  Per MBU RN Andrey Cota - baby has latched well with swallows.  Baby last had attempt at 220p for  5 mns.  LC reviewed basics - importance of skin to skin feedings  Until the baby can stay awake for feedings. And if the baby hasn't shown signs of hunger and it has been a few hours , checking diaper , change if needed and placing baby skin to skin would be encouraged to work on the breast feeding hormones until the abby is showing feeding cues. Unable to show mom hand expressing due to company in the room or ask mom how her breast changes were with pregnancy.      Maternal Data    Feeding Feeding Type:  (per mom last fed at 220P for 5 mins / sleeping at present ) Length of feed: 5 min (RN reports swallows )  LATCH Score/Interventions Latch: Grasps breast easily, tongue down, lips flanged, rhythmical sucking. Intervention(s): Adjust position;Assist with latch;Breast massage;Breast compression  Audible Swallowing: Spontaneous and intermittent Intervention(s): Hand expression;Skin to skin  Type of Nipple: Everted at rest and after stimulation  Comfort (Breast/Nipple): Soft / non-tender     Hold (Positioning): Assistance needed to correctly position infant at breast and maintain latch. Intervention(s): Breastfeeding basics reviewed  LATCH Score: 9  Lactation Tools Discussed/Used     Consult Status Consult Status: Follow-up Date: 07/28/15 Follow-up type: In-patient    Kathrin Greathouse 07/28/2015, 3:46 PM

## 2015-07-28 NOTE — Anesthesia Postprocedure Evaluation (Signed)
Anesthesia Post Note  Patient: Joy [name]  Procedure(s) Performed: * No procedures listed *  Patient location during evaluation: Mother Baby Anesthesia Type: Epidural Level of consciousness: awake and alert Pain management: pain level controlled Vital Signs Assessment: post-procedure vital signs reviewed and stable Respiratory status: spontaneous breathing, nonlabored ventilation and respiratory function stable Cardiovascular status: stable Postop Assessment: no headache, no backache, epidural receding and patient able to bend at knees Anesthetic complications: no     Last Vitals:  Vitals:   07/27/15 2130 07/28/15 0600  BP: 126/60 (!) 133/92  Pulse: 79 93  Resp: 18 18  Temp: 36.8 C 37.1 C    Last Pain:  Vitals:   07/28/15 0600  TempSrc: Oral  PainSc:    Pain Goal: Patients Stated Pain Goal: 0 (07/27/15 8757)               Rica Records

## 2015-07-28 NOTE — Lactation Note (Signed)
This note was copied from a baby's chart. Lactation Consultation Note  Patient Name: Joy [name] [id number] Date: 07/28/2015 Reason for consult: Follow-up assessment;Breast/nipple pain Mom c/o of sore nipples worse on left breast. Nipple red but no breakdown observed. Assisted Mom with positioning and using breast compression to latch to left breast, Mom denied any pain. Reviewed how to bring bottom lip down. Encouraged Mom to apply EBM along with coconut oil for tenderness. Basic teaching reviewed, Mom has been nursing more on right breast due to nipple pain on left. Encouraged to alternate breast with feeding. Cluster feeding discussed.  Encouraged Mom to call for assist with latch if tenderness continues or getting worse.   Maternal Data    Feeding Feeding Type: Breast Fed  LATCH Score/Interventions Latch: Grasps breast easily, tongue down, lips flanged, rhythmical sucking. Intervention(s): Adjust position;Assist with latch;Breast massage;Breast compression  Audible Swallowing: A few with stimulation Intervention(s): Hand expression;Skin to skin  Type of Nipple: Everted at rest and after stimulation  Comfort (Breast/Nipple): Filling, red/small blisters or bruises, mild/mod discomfort  Problem noted: Mild/Moderate discomfort Interventions (Mild/moderate discomfort): Hand expression (EBM/coconut oil)  Hold (Positioning): Assistance needed to correctly position infant at breast and maintain latch. Intervention(s): Breastfeeding basics reviewed;Support Pillows;Position options;Skin to skin  LATCH Score: 7  Lactation Tools Discussed/Used Tools: Other (comment) (coconut oil)   Consult Status Consult Status: Follow-up Date: 07/29/15 Follow-up type: In-patient    Alfred Levins 07/28/2015, 10:57 PM

## 2015-07-29 MED ORDER — IBUPROFEN 600 MG PO TABS: 600.0000 mg | ORAL_TABLET | Freq: Four times a day (QID) | ORAL | 2 refills | Status: DC

## 2015-07-29 NOTE — Lactation Note (Signed)
This note was copied from a baby's chart. Lactation Consultation Note  Patient Name: Joy [name] [id number] Date: 07/29/2015 Reason for consult: Follow-up assessment   Maternal Data    Feeding Feeding Type: Breast Fed Length of feed: 7 min  LATCH Score/Interventions Latch: Too sleepy or reluctant, no latch achieved, no sucking elicited. Intervention(s): Adjust position;Assist with latch  Audible Swallowing: None (easily expressed colostrum) Intervention(s): Skin to skin;Hand expression  Type of Nipple: Everted at rest and after stimulation  Comfort (Breast/Nipple): Soft / non-tender     Hold (Positioning): Assistance needed to correctly position infant at breast and maintain latch. Intervention(s): Breastfeeding basics reviewed;Support Pillows;Position options;Skin to skin  LATCH Score: 5  Lactation Tools Discussed/Used    Brief consult with this mom and baby. Bay sleepy and did not want to feed at this time. I will follow up with mom and baby later.    Consult Status Consult Status: Follow-up Date: 07/29/15 Follow-up type: In-patient    Alfred Levins 07/29/2015, 8:11 AM

## 2015-07-29 NOTE — Discharge Summary (Signed)
[facility] Discharge Summary   Patient Name:   Joy [name] DOB:     June 19, 1990 MRN:     [id number]  Date of Admission:   07/27/2015 Date of Discharge:  07/29/2015  Admitting diagnosis:    38 WEEKS WATER HAS BROKEN Active Problems:   Morbid obesity with BMI of 40.0-44.9, adult [facility])  Term Pregnancy Delivered and Anemia    Discharge diagnosis:    38 WEEKS WATER HAS BROKEN Active Problems:   Morbid obesity with BMI of 40.0-44.9, adult [facility])  Term Pregnancy Delivered and Anemia                                                                     Post partum procedures: None  Type of Delivery:  SVB  Delivering Provider: Myna Hidalgo   Date of Delivery:  07/27/2015  Newborn Data:    Live born female  Birth Weight: 6 lb 7 oz (2920 g) APGARS: 9, 9  Baby's Name:  Joy [name] Baby Feeding:   Breast Disposition:   home with mother  Complications:   None  Hospital course:      Onset of Labor With Vaginal Delivery     25 y.o. yo G1P1001 at [redacted]w[redacted]d was admitted in Latent Labor on 07/27/2015. Patient had an uncomplicated labor course as follows:  Membrane Rupture Time/Date: 1:15 AM ,07/27/2015   Intrapartum Procedures: Episiotomy: None [1]                                         Lacerations:  2nd degree [3]  Patient had a delivery of a Viable infant. 07/27/2015  Information for the patient's newborn:  Joy [name], Joy [name] [[id number]]  Delivery Method: Vaginal, Spontaneous Delivery (Filed from Delivery Summary)    Pateint had an uncomplicated postpartum course.  She is ambulating, tolerating a regular diet, passing flatus, and urinating well. Patient is discharged home in stable condition on 07/29/15.    Physical Exam:  Vitals:   07/27/15 2130 07/28/15 0600 07/28/15 1756 07/29/15 0525  BP: 126/60 (!) 133/92 127/68 (!) 112/59  Pulse: 79 93 96 74  Resp: Temp: 98.2 F (36.8 C) 98.7 F (37.1 C) 98.3 F (36.8 C) 98 F (36.7 C)  TempSrc: Oral Oral  Oral Oral  SpO2:   100%   Weight:      Height:       General: alert, cooperative and no distress Lochia: appropriate Uterine Fundus: firm Incision: Healing well with no significant drainage DVT Evaluation: No evidence of DVT seen on physical exam. Negative Homan's sign. No cords or calf tenderness.  Labs: Lab Results  Component Value Date   WBC 12.3 (H) 07/28/2015   HGB 10.7 (L) 07/28/2015   HCT 30.7 (L) 07/28/2015   MCV 90.0 07/28/2015   PLT 224 07/28/2015   CMP Latest Ref Rng & Units 07/27/2015  Glucose 65 - 99 mg/dL 83  BUN 6 - 20 mg/dL 9  Creatinine 0.86 - 5.78 mg/dL 4.69  Sodium 629 - 528 mmol/L 133(L)  Potassium 3.5 - 5.1 mmol/L 4.1  Chloride 101 - 111 mmol/L 103  CO2 22 - 32  mmol/L 21(L)  Calcium 8.9 - 10.3 mg/dL 9.4  Total Protein 6.5 - 8.1 g/dL 6.4(L)  Total Bilirubin 0.3 - 1.2 mg/dL 0.4  Alkaline Phos 38 - 126 U/L 129(H)  AST 15 - 41 U/L 21  ALT 14 - 54 U/L 15   Prenatal labs: ABO, Rh: O/Positive/-- (12/21 0000) Antibody: Negative (12/21 0000) Rubella: Immune (12/21 0000) RPR: Nonreactive (12/21 0000)  HBsAg: Negative (12/21 0000)  HIV: Non-reactive (12/21 0000)  GBS: Positive (07/11 0000)   Discharge instruction: per After Visit Summary and "Baby and Me Booklet".  After Visit Meds:    Medication List    STOP taking these medications   progesterone 200 MG capsule Commonly known as:  PROMETRIUM     TAKE these medications   ibuprofen 600 MG tablet Commonly known as:  ADVIL,MOTRIN Take 1 tablet (600 mg total) by mouth every 6 (six) hours.   prenatal multivitamin Tabs tablet Take 1 tablet by mouth daily at 12 noon.       Diet: routine diet  Activity: Advance as tolerated. Pelvic rest for 6 weeks.   Outpatient follow up:6 weeks  Postpartum contraception: None  07/29/2015 Sherre Scarlet, CNM

## 2015-07-29 NOTE — Discharge Instructions (Signed)
Iron-Rich Diet °Iron is a mineral that helps your body to produce hemoglobin. Hemoglobin is a protein in your red blood cells that carries oxygen to your body's tissues. Eating too little iron may cause you to feel weak and tired, and it can increase your risk for infection. Eating enough iron is necessary for your body's metabolism, muscle function, and nervous system. °Iron is naturally found in many foods. It can also be added to foods or fortified in foods. There are two types of dietary iron: °· Heme iron. Heme iron is absorbed by the body more easily than nonheme iron. Heme iron is found in meat, poultry, and fish. °· Nonheme iron. Nonheme iron is found in dietary supplements, iron-fortified grains, beans, and vegetables. °You may need to follow an iron-rich diet if: °· You have been diagnosed with iron deficiency or iron-deficiency anemia. °· You have a condition that prevents you from absorbing dietary iron, such as: °¨ Infection in your intestines. °¨ Celiac disease. This involves long-lasting (chronic) inflammation of your intestines. °· You do not eat enough iron. °· You eat a diet that is high in foods that impair iron absorption. °· You have lost a lot of blood. °· You have heavy bleeding during your menstrual cycle. °· You are pregnant. °WHAT IS MY PLAN? °Your health care provider may help you to determine how much iron you need per day based on your condition. Generally, when a person consumes sufficient amounts of iron in the diet, the following iron needs are met: °· Men. °¨ 14-18 years old: 11 mg per day. °¨ 19-50 years old: 8 mg per day. °· Women.   °¨ 14-18 years old: 15 mg per day. °¨ 19-50 years old: 18 mg per day. °¨ Over 50 years old: 8 mg per day. °¨ Pregnant women: 27 mg per day. °¨ Breastfeeding women: 9 mg per day. °WHAT DO I NEED TO KNOW ABOUT AN IRON-RICH DIET? °· Eat fresh fruits and vegetables that are high in vitamin C along with foods that are high in iron. This will help increase  the amount of iron that your body absorbs from food, especially with foods containing nonheme iron. Foods that are high in vitamin C include oranges, peppers, tomatoes, and mango. °· Take iron supplements only as directed by your health care provider. Overdose of iron can be life-threatening. If you were prescribed iron supplements, take them with orange juice or a vitamin C supplement. °· Cook foods in pots and pans that are made from iron.   °· Eat nonheme iron-containing foods alongside foods that are high in heme iron. This helps to improve your iron absorption.   °· Certain foods and drinks contain compounds that impair iron absorption. Avoid eating these foods in the same meal as iron-rich foods or with iron supplements. These include: °¨ Coffee, black tea, and red wine. °¨ Milk, dairy products, and foods that are high in calcium. °¨ Beans, soybeans, and peas. °¨ Whole grains. °· When eating foods that contain both nonheme iron and compounds that impair iron absorption, follow these tips to absorb iron better.   °¨ Soak beans overnight before cooking. °¨ Soak whole grains overnight and drain them before using. °¨ Ferment flours before baking, such as using yeast in bread dough. °WHAT FOODS CAN I EAT? °Grains  °Iron-fortified breakfast cereal. Iron-fortified whole-wheat bread. Enriched rice. Sprouted grains. °Vegetables  °Spinach. Potatoes with skin. Green peas. Broccoli. Red and green bell peppers. Fermented vegetables. °Fruits  °Prunes. Raisins. Oranges. Strawberries. Mango. Grapefruit. °Meats and Other Protein   Sources Beef liver. Oysters. Beef. Shrimp. [place]. Chicken. [place]. Sardines. Chickpeas. Nuts. Tofu. Beverages Tomato juice. Fresh orange juice. Prune juice. Hibiscus tea. Fortified instant breakfast shakes. Condiments Tahini. Fermented soy sauce. Sweets and Desserts Black-strap molasses.  Other Wheat germ. The items listed above may not be a complete list of recommended foods or  beverages. Contact your dietitian for more options. WHAT FOODS ARE NOT RECOMMENDED? Grains Whole grains. Bran cereal. Bran flour. Oats. Vegetables Artichokes. Brussels sprouts. Kale. Fruits Blueberries. Raspberries. Strawberries. Figs. Meats and Other Protein Sources Soybeans. Products made from soy protein. Dairy Milk. Cream. Cheese. Yogurt. Cottage cheese. Beverages Coffee. Black tea. Red wine. Sweets and Desserts Cocoa. Chocolate. Ice cream. Other Basil. Oregano. Parsley. The items listed above may not be a complete list of foods and beverages to avoid. Contact your dietitian for more information.   This information is not intended to replace advice given to you by your health care provider. Make sure you discuss any questions you have with your health care provider.   Document Released: 08/05/2004 Document Revised: 01/12/2014 Document Reviewed: 07/19/2013 Elsevier Interactive Patient Education 2016 [employer] Breastfeeding and Mastitis Mastitis is inflammation of the breast tissue. It can occur in women who are breastfeeding. This can make breastfeeding painful. Mastitis will sometimes go away on its own. Your health care provider will help determine if treatment is needed. CAUSES Mastitis is often associated with a blocked milk (lactiferous) duct. This can happen when too much milk builds up in the breast. Causes of excess milk in the breast can include:  Poor latch-on. If your baby is not latched onto the breast properly, she or he may not empty your breast completely while breastfeeding.  Allowing too much time to pass between feedings.  Wearing a bra or other clothing that is too tight. This puts extra pressure on the lactiferous ducts so milk does not flow through them as it should. Mastitis can also be caused by a bacterial infection. Bacteria may enter the breast tissue through cuts or openings in the skin. In women who are breastfeeding, this may occur  because of cracked or irritated skin. Cracks in the skin are often caused when your baby does not latch on properly to the breast. SIGNS AND SYMPTOMS  Swelling, redness, tenderness, and pain in an area of the breast.  Swelling of the glands under the arm on the same side.  Fever may or may not accompany mastitis. If an infection is allowed to progress, a collection of pus (abscess) may develop. DIAGNOSIS  Your health care provider can usually diagnose mastitis based on your symptoms and a physical exam. Tests may be done to help confirm the diagnosis. These may include:  Removal of pus from the breast by applying pressure to the area. This pus can be examined in the lab to determine which bacteria are present. If an abscess has developed, the fluid in the abscess can be removed with a needle. This can also be used to confirm the diagnosis and determine the bacteria present. In most cases, pus will not be present.  Blood tests to determine if your body is fighting a bacterial infection.  Mammogram or ultrasound tests to rule out other problems or diseases. TREATMENT  Mastitis that occurs with breastfeeding will sometimes go away on its own. Your health care provider may choose to wait 24 hours after first seeing you to decide whether a prescription medicine is needed. If your symptoms are worse after 24 hours, your health care provider  will likely prescribe an antibiotic medicine to treat the mastitis. He or she will determine which bacteria are most likely causing the infection and will then select an appropriate antibiotic medicine. This is sometimes changed based on the results of tests performed to identify the bacteria, or if there is no response to the antibiotic medicine selected. Antibiotic medicines are usually given by mouth. You may also be given medicine for pain. HOME CARE INSTRUCTIONS  Only take over-the-counter or prescription medicines for pain, fever, or discomfort as directed by  your health care provider.  If your health care provider prescribed an antibiotic medicine, take the medicine as directed. Make sure you finish it even if you start to feel better.  Do not wear a tight or underwire bra. Wear a soft, supportive bra.  Increase your fluid intake, especially if you have a fever.  Continue to empty the breast. Your health care provider can tell you whether this milk is safe for your infant or needs to be thrown out. You may be told to stop nursing until your health care provider thinks it is safe for your baby. Use a breast pump if you are advised to stop nursing.  Keep your nipples clean and dry.  Empty the first breast completely before going to the other breast. If your baby is not emptying your breasts completely for some reason, use a breast pump to empty your breasts.  If you go back to work, pump your breasts while at work to stay in time with your nursing schedule.  Avoid allowing your breasts to become overly filled with milk (engorged). SEEK MEDICAL CARE IF:  You have pus-like discharge from the breast.  Your symptoms do not improve with the treatment prescribed by your health care provider within 2 days. SEEK IMMEDIATE MEDICAL CARE IF:  Your pain and swelling are getting worse.  You have pain that is not controlled with medicine.  You have a red line extending from the breast toward your armpit.  You have a fever or persistent symptoms for more than 2-3 days.  You have a fever and your symptoms suddenly get worse. MAKE SURE YOU:   Understand these instructions.  Will watch your condition.  Will get help right away if you are not doing well or get worse.   This information is not intended to replace advice given to you by your health care provider. Make sure you discuss any questions you have with your health care provider.   Document Released: 04/18/2004 Document Revised: 12/27/2012 Document Reviewed: 07/28/2012 Elsevier Interactive  Patient Education [employer]. Breastfeeding Deciding to breastfeed is one of the best choices you can make for you and your baby. A change in hormones during pregnancy causes your breast tissue to grow and increases the number and size of your milk ducts. These hormones also allow proteins, sugars, and fats from your blood supply to make breast milk in your milk-producing glands. Hormones prevent breast milk from being released before your baby is born as well as prompt milk flow after birth. Once breastfeeding has begun, thoughts of your baby, as well as his or her sucking or crying, can stimulate the release of milk from your milk-producing glands.  BENEFITS OF BREASTFEEDING For Your Baby  Your first milk (colostrum) helps your baby's digestive system function better.  There are antibodies in your milk that help your baby fight off infections.  Your baby has a lower incidence of asthma, allergies, and sudden infant death syndrome.  The nutrients in breast milk are better for your baby than infant formulas and are designed uniquely for your baby's needs.  Breast milk improves your baby's brain development.  Your baby is less likely to develop other conditions, such as childhood obesity, asthma, or type 2 diabetes mellitus. For You  Breastfeeding helps to create a very special bond between you and your baby.  Breastfeeding is convenient. Breast milk is always available at the correct temperature and costs nothing.  Breastfeeding helps to burn calories and helps you lose the weight gained during pregnancy.  Breastfeeding makes your uterus contract to its prepregnancy size faster and slows bleeding (lochia) after you give birth.   Breastfeeding helps to lower your risk of developing type 2 diabetes mellitus, osteoporosis, and breast or ovarian cancer later in life. SIGNS THAT YOUR BABY IS HUNGRY Early Signs of Hunger  Increased alertness or activity.  Stretching.  Movement  of the head from side to side.  Movement of the head and opening of the mouth when the corner of the mouth or cheek is stroked (rooting).  Increased sucking sounds, smacking lips, cooing, sighing, or squeaking.  Hand-to-mouth movements.  Increased sucking of fingers or hands. Late Signs of Hunger  Fussing.  Intermittent crying. Extreme Signs of Hunger Signs of extreme hunger will require calming and consoling before your baby will be able to breastfeed successfully. Do not wait for the following signs of extreme hunger to occur before you initiate breastfeeding:  Restlessness.  A loud, strong cry.  Screaming. BREASTFEEDING BASICS Breastfeeding Initiation  Find a comfortable place to sit or lie down, with your neck and back well supported.  Place a pillow or rolled up blanket under your baby to bring him or her to the level of your breast (if you are seated). Nursing pillows are specially designed to help support your arms and your baby while you breastfeed.  Make sure that your baby's abdomen is facing your abdomen.  Gently massage your breast. With your fingertips, massage from your chest wall toward your nipple in a circular motion. This encourages milk flow. You may need to continue this action during the feeding if your milk flows slowly.  Support your breast with 4 fingers underneath and your thumb above your nipple. Make sure your fingers are well away from your nipple and your baby's mouth.  Stroke your baby's lips gently with your finger or nipple.  When your baby's mouth is open wide enough, quickly bring your baby to your breast, placing your entire nipple and as much of the colored area around your nipple (areola) as possible into your baby's mouth.  More areola should be visible above your baby's upper lip than below the lower lip.  Your baby's tongue should be between his or her lower gum and your breast.  Ensure that your baby's mouth is correctly positioned  around your nipple (latched). Your baby's lips should create a seal on your breast and be turned out (everted).  It is common for your baby to suck about 2-3 minutes in order to start the flow of breast milk. Latching Teaching your baby how to latch on to your breast properly is very important. An improper latch can cause nipple pain and decreased milk supply for you and poor weight gain in your baby. Also, if your baby is not latched onto your nipple properly, he or she may swallow some air during feeding. This can make your baby fussy. Burping your baby when you switch breasts  during the feeding can help to get rid of the air. However, teaching your baby to latch on properly is still the best way to prevent fussiness from swallowing air while breastfeeding. Signs that your baby has successfully latched on to your nipple:  Silent tugging or silent sucking, without causing you pain.  Swallowing heard between every 3-4 sucks.  Muscle movement above and in front of his or her ears while sucking. Signs that your baby has not successfully latched on to nipple:  Sucking sounds or smacking sounds from your baby while breastfeeding.  Nipple pain. If you think your baby has not latched on correctly, slip your finger into the corner of your baby's mouth to break the suction and place it between your baby's gums. Attempt breastfeeding initiation again. Signs of Successful Breastfeeding Signs from your baby:  A gradual decrease in the number of sucks or complete cessation of sucking.  Falling asleep.  Relaxation of his or her body.  Retention of a small amount of milk in his or her mouth.  Letting go of your breast by himself or herself. Signs from you:  Breasts that have increased in firmness, weight, and size 1-3 hours after feeding.  Breasts that are softer immediately after breastfeeding.  Increased milk volume, as well as a change in milk consistency and color by the fifth day of  breastfeeding.  Nipples that are not sore, cracked, or bleeding. Signs That Your [employer] is Getting Enough Milk  Wetting at least 3 diapers in a 24-hour period. The urine should be clear and pale yellow by age 540 days.  At least 3 stools in a 24-hour period by age 540 days. The stool should be soft and yellow.  At least 3 stools in a 24-hour period by age 54 days. The stool should be seedy and yellow.  No loss of weight greater than 10% of birth weight during the first 4 days of age.  Average weight gain of 4-7 ounces (113-198 g) per week after age 45 days.  Consistent daily weight gain by age 33 days, without weight loss after the age of 2 weeks. After a feeding, your baby may spit up a small amount. This is common. BREASTFEEDING FREQUENCY AND DURATION Frequent feeding will help you make more milk and can prevent sore nipples and breast engorgement. Breastfeed when you feel the need to reduce the fullness of your breasts or when your baby shows signs of hunger. This is called "breastfeeding on demand." Avoid introducing a pacifier to your baby while you are working to establish breastfeeding (the first 4-6 weeks after your baby is born). After this time you may choose to use a pacifier. Research has shown that pacifier use during the first year of a baby's life decreases the risk of sudden infant death syndrome (SIDS). Allow your baby to feed on each breast as long as he or she wants. Breastfeed until your baby is finished feeding. When your baby unlatches or falls asleep while feeding from the first breast, offer the second breast. Because newborns are often sleepy in the first few weeks of life, you may need to awaken your baby to get him or her to feed. Breastfeeding times will vary from baby to baby. However, the following rules can serve as a guide to help you ensure that your baby is properly fed:  Newborns (babies 46 weeks of age or younger) may breastfeed every 1-3 hours.  Newborns should not  go longer than 3 hours during the  day or 5 hours during the night without breastfeeding.  You should breastfeed your baby a minimum of 8 times in a 24-hour period until you begin to introduce solid foods to your baby at around 39 months of age. BREAST MILK PUMPING Pumping and storing breast milk allows you to ensure that your baby is exclusively fed your breast milk, even at times when you are unable to breastfeed. This is especially important if you are going back to work while you are still breastfeeding or when you are not able to be present during feedings. Your lactation consultant can give you guidelines on how long it is safe to store breast milk. A breast pump is a machine that allows you to pump milk from your breast into a sterile bottle. The pumped breast milk can then be stored in a refrigerator or freezer. Some breast pumps are operated by hand, while others use electricity. Ask your lactation consultant which type will work best for you. Breast pumps can be purchased, but some hospitals and breastfeeding support groups lease breast pumps on a monthly basis. A lactation consultant can teach you how to hand express breast milk, if you prefer not to use a pump. CARING FOR YOUR BREASTS WHILE YOU BREASTFEED Nipples can become dry, cracked, and sore while breastfeeding. The following recommendations can help keep your breasts moisturized and healthy:  Avoid using soap on your nipples.  Wear a supportive bra. Although not required, special nursing bras and tank tops are designed to allow access to your breasts for breastfeeding without taking off your entire bra or top. Avoid wearing underwire-style bras or extremely tight bras.  Air dry your nipples for 3-14mnutes after each feeding.  Use only cotton bra pads to absorb leaked breast milk. Leaking of breast milk between feedings is normal.  Use lanolin on your nipples after breastfeeding. Lanolin helps to maintain your skin's normal moisture  barrier. If you use pure lanolin, you do not need to wash it off before feeding your baby again. Pure lanolin is not toxic to your baby. You may also hand express a few drops of breast milk and gently massage that milk into your nipples and allow the milk to air dry. In the first few weeks after giving birth, some women experience extremely full breasts (engorgement). Engorgement can make your breasts feel heavy, warm, and tender to the touch. Engorgement peaks within 3-5 days after you give birth. The following recommendations can help ease engorgement:  Completely empty your breasts while breastfeeding or pumping. You may want to start by applying warm, moist heat (in the shower or with warm water-soaked hand towels) just before feeding or pumping. This increases circulation and helps the milk flow. If your baby does not completely empty your breasts while breastfeeding, pump any extra milk after he or she is finished.  Wear a snug bra (nursing or regular) or tank top for 1-2 days to signal your body to slightly decrease milk production.  Apply ice packs to your breasts, unless this is too uncomfortable for you.  Make sure that your baby is latched on and positioned properly while breastfeeding. If engorgement persists after 48 hours of following these recommendations, contact your health care provider or a lScience writer OVERALL HEALTH CARE RECOMMENDATIONS WHILE BREASTFEEDING  Eat healthy foods. Alternate between meals and snacks, eating 3 of each per day. Because what you eat affects your breast milk, some of the foods may make your baby more irritable than usual. Avoid eating these  foods if you are sure that they are negatively affecting your baby.  Drink milk, fruit juice, and water to satisfy your thirst (about 10 glasses a day).  Rest often, relax, and continue to take your prenatal vitamins to prevent fatigue, stress, and anemia.  Continue breast self-awareness checks.  Avoid  chewing and smoking tobacco. Chemicals from cigarettes that pass into breast milk and exposure to secondhand smoke may harm your baby.  Avoid alcohol and drug use, including marijuana. Some medicines that may be harmful to your baby can pass through breast milk. It is important to ask your health care provider before taking any medicine, including all over-the-counter and prescription medicine as well as vitamin and herbal supplements. It is possible to become pregnant while breastfeeding. If birth control is desired, ask your health care provider about options that will be safe for your baby. SEEK MEDICAL CARE IF:  You feel like you want to stop breastfeeding or have become frustrated with breastfeeding.  You have painful breasts or nipples.  Your nipples are cracked or bleeding.  Your breasts are red, tender, or warm.  You have a swollen area on either breast.  You have a fever or chills.  You have nausea or vomiting.  You have drainage other than breast milk from your nipples.  Your breasts do not become full before feedings by the fifth day after you give birth.  You feel sad and depressed.  Your baby is too sleepy to eat well.  Your baby is having trouble sleeping.   Your baby is wetting less than 3 diapers in a 24-hour period.  Your baby has less than 3 stools in a 24-hour period.  Your baby's skin or the white part of his or her eyes becomes yellow.   Your baby is not gaining weight by 69 days of age. SEEK IMMEDIATE MEDICAL CARE IF:  Your baby is overly tired (lethargic) and does not want to wake up and feed.  Your baby develops an unexplained fever.   This information is not intended to replace advice given to you by your health care provider. Make sure you discuss any questions you have with your health care provider.   Document Released: 12/22/2004 Document Revised: 09/12/2014 Document Reviewed: 06/15/2012 Elsevier Interactive Patient Education 2016 [employer]. Postpartum Depression and Baby Blues The postpartum period begins right after the birth of a baby. During this time, there is often a great amount of joy and excitement. It is also a time of many changes in the life of the parents. Regardless of how many times a mother gives birth, each child brings new challenges and dynamics to the family. It is not unusual to have feelings of excitement along with confusing shifts in moods, emotions, and thoughts. All mothers are at risk of developing postpartum depression or the "baby blues." These mood changes can occur right after giving birth, or they may occur many months after giving birth. The baby blues or postpartum depression can be mild or severe. Additionally, postpartum depression can go away rather quickly, or it can be a long-term condition.  CAUSES Raised hormone levels and the rapid drop in those levels are thought to be a main cause of postpartum depression and the baby blues. A number of hormones change during and after pregnancy. Estrogen and progesterone usually decrease right after the delivery of your baby. The levels of thyroid hormone and various cortisol steroids also rapidly drop. Other factors that play a role in these mood changes  include major life events and genetics.  RISK FACTORS If you have any of the following risks for the baby blues or postpartum depression, know what symptoms to watch out for during the postpartum period. Risk factors that may increase the likelihood of getting the baby blues or postpartum depression include:  Having a personal or family history of depression.   Having depression while being pregnant.   Having premenstrual mood issues or mood issues related to oral contraceptives.  Having a lot of life stress.   Having marital conflict.   Lacking a social support network.   Having a baby with special needs.   Having health problems, such as diabetes.  SIGNS AND SYMPTOMS Symptoms of baby  blues include:  Brief changes in mood, such as going from extreme happiness to sadness.  Decreased concentration.   Difficulty sleeping.   Crying spells, tearfulness.   Irritability.   Anxiety.  Symptoms of postpartum depression typically begin within the first month after giving birth. These symptoms include:  Difficulty sleeping or excessive sleepiness.   Marked weight loss.   Agitation.   Feelings of worthlessness.   Lack of interest in activity or food.  Postpartum psychosis is a very serious condition and can be dangerous. Fortunately, it is rare. Displaying any of the following symptoms is cause for immediate medical attention. Symptoms of postpartum psychosis include:   Hallucinations and delusions.   Bizarre or disorganized behavior.   Confusion or disorientation.  DIAGNOSIS  A diagnosis is made by an evaluation of your symptoms. There are no medical or lab tests that lead to a diagnosis, but there are various questionnaires that a health care provider may use to identify those with the baby blues, postpartum depression, or psychosis. Often, a screening tool called the [place] Postnatal Depression Scale is used to diagnose depression in the postpartum period.  TREATMENT The baby blues usually goes away on its own in 1-2 weeks. Social support is often all that is needed. You will be encouraged to get adequate sleep and rest. Occasionally, you may be given medicines to help you sleep.  Postpartum depression requires treatment because it can last several months or longer if it is not treated. Treatment may include individual or group therapy, medicine, or both to address any social, physiological, and psychological factors that may play a role in the depression. Regular exercise, a healthy diet, rest, and social support may also be strongly recommended.  Postpartum psychosis is more serious and needs treatment right away. Hospitalization is often needed. HOME  CARE INSTRUCTIONS  Get as much rest as you can. Nap when the baby sleeps.   Exercise regularly. Some women find yoga and walking to be beneficial.   Eat a balanced and nourishing diet.   Do little things that you enjoy. Have a cup of tea, take a bubble bath, read your favorite magazine, or listen to your favorite music.  Avoid alcohol.   Ask for help with household chores, cooking, grocery shopping, or running errands as needed. Do not try to do everything.   Talk to people close to you about how you are feeling. Get support from your partner, family members, friends, or other new moms.  Try to stay positive in how you think. Think about the things you are grateful for.   Do not spend a lot of time alone.   Only take over-the-counter or prescription medicine as directed by your health care provider.  Keep all your postpartum appointments.   Let your health  care provider know if you have any concerns.  SEEK MEDICAL CARE IF: You are having a reaction to or problems with your medicine. SEEK IMMEDIATE MEDICAL CARE IF:  You have suicidal feelings.   You think you may harm the baby or someone else. MAKE SURE YOU:  Understand these instructions.  Will watch your condition.  Will get help right away if you are not doing well or get worse.   This information is not intended to replace advice given to you by your health care provider. Make sure you discuss any questions you have with your health care provider.   Document Released: 09/26/2003 Document Revised: 12/27/2012 Document Reviewed: 10/03/2012 Elsevier Interactive Patient Education 2016 Elsevier Inc. Postpartum Care After Vaginal Delivery After you deliver your newborn (postpartum period), the usual stay in the hospital is 24-72 hours. If there were problems with your labor or delivery, or if you have other medical problems, you might be in the hospital longer.  While you are in the hospital, you will receive help  and instructions on how to care for yourself and your newborn during the postpartum period.  While you are in the hospital:  Be sure to tell your nurses if you have pain or discomfort, as well as where you feel the pain and what makes the pain worse.  If you had an incision made near your vagina (episiotomy) or if you had some tearing during delivery, the nurses may put ice packs on your episiotomy or tear. The ice packs may help to reduce the pain and swelling.  If you are breastfeeding, you may feel uncomfortable contractions of your uterus for a couple of weeks. This is normal. The contractions help your uterus get back to normal size.  It is normal to have some bleeding after delivery.  For the first 1-3 days after delivery, the flow is red and the amount may be similar to a period.  It is common for the flow to start and stop.  In the first few days, you may pass some small clots. Let your nurses know if you begin to pass large clots or your flow increases.  Do not  flush blood clots down the toilet before having the nurse look at them.  During the next 3-10 days after delivery, your flow should become more watery and pink or brown-tinged in color.  Ten to fourteen days after delivery, your flow should be a small amount of yellowish-white discharge.  The amount of your flow will decrease over the first few weeks after delivery. Your flow may stop in 6-8 weeks. Most women have had their flow stop by 12 weeks after delivery.  You should change your sanitary pads frequently.  Wash your hands thoroughly with soap and water for at least 20 seconds after changing pads, using the toilet, or before holding or feeding your newborn.  You should feel like you need to empty your bladder within the first 6-8 hours after delivery.  In case you become weak, lightheaded, or faint, call your nurse before you get out of bed for the first time and before you take a shower for the first  time.  Within the first few days after delivery, your breasts may begin to feel tender and full. This is called engorgement. Breast tenderness usually goes away within 48-72 hours after engorgement occurs. You may also notice milk leaking from your breasts. If you are not breastfeeding, do not stimulate your breasts. Breast stimulation can make your breasts produce  more milk.  Spending as much time as possible with your newborn is very important. During this time, you and your newborn can feel close and get to know each other. Having your newborn stay in your room (rooming in) will help to strengthen the bond with your newborn. It will give you time to get to know your newborn and become comfortable caring for your newborn.  Your hormones change after delivery. Sometimes the hormone changes can temporarily cause you to feel sad or tearful. These feelings should not last more than a few days. If these feelings last longer than that, you should talk to your caregiver.  If desired, talk to your caregiver about methods of family planning or contraception.  Talk to your caregiver about immunizations. Your caregiver may want you to have the following immunizations before leaving the hospital:  Tetanus, diphtheria, and pertussis (Tdap) or tetanus and diphtheria (Td) immunization. It is very important that you and your family (including grandparents) or others caring for your newborn are up-to-date with the Tdap or Td immunizations. The Tdap or Td immunization can help protect your newborn from getting ill.  Rubella immunization.  Varicella (chickenpox) immunization.  Influenza immunization. You should receive this annual immunization if you did not receive the immunization during your pregnancy.   This information is not intended to replace advice given to you by your health care provider. Make sure you discuss any questions you have with your health care provider.   Document Released: 10/19/2006  Document Revised: 09/16/2011 Document Reviewed: 08/19/2011 Elsevier Interactive Patient Education [employer].

## 2015-07-29 NOTE — Lactation Note (Signed)
This note was copied from a baby's chart. Lactation Consultation Note Baby had a 7% weight loss in 33 hrs. Baby is cluster feeding now. Moms breast feels softer after BF. Hand expressed clear colostrum. Moms breast are tender. Encouraged occasional compression or massage during BF. Encouraged STS, and I&O. Gave mom LPI information sheet about supplementing d/t lost down to 5.15lbs. Gave mom bullet for collecting colostrum and curve tip syring to finger feed w/colostrum. encouraged strict I&O.  Patient Name: Girl Malibu Arizola Today's Date: 07/29/2015 Reason for consult: Infant weight loss   Maternal Data Has patient been taught Hand Expression?: Yes Does the patient have breastfeeding experience prior to this delivery?: No  Feeding Feeding Type: Breast Fed Length of feed: 20 min  LATCH Score/Interventions Latch: Grasps breast easily, tongue down, lips flanged, rhythmical sucking. Intervention(s): Breast massage;Breast compression  Audible Swallowing: A few with stimulation Intervention(s): Skin to skin;Hand expression Intervention(s): Alternate breast massage  Type of Nipple: Everted at rest and after stimulation  Comfort (Breast/Nipple): Filling, red/small blisters or bruises, mild/mod discomfort  Problem noted: Mild/Moderate discomfort Interventions (Mild/moderate discomfort): Hand massage;Hand expression  Hold (Positioning): No assistance needed to correctly position infant at breast. Intervention(s): Breastfeeding basics reviewed;Support Pillows;Position options;Skin to skin  LATCH Score: 8  Lactation Tools Discussed/Used Tools: Pump Breast pump type: Manual Pump Review: Setup, frequency, and cleaning;Milk Storage Initiated by:: Peri Jefferson RN Date initiated:: 07/29/15   Consult Status Consult Status: Follow-up Date: 07/29/15 Follow-up type: In-patient    Charyl Dancer 07/29/2015, 2:43 AM

## 2016-04-07 ENCOUNTER — Inpatient Hospital Stay (HOSPITAL_COMMUNITY)
Admission: AD | Admit: 2016-04-07 | Discharge: 2016-04-07 | Disposition: A | Discharge status: Home / Self Care | Payer: Medicaid Other | Source: Ambulatory Visit | Attending: Family Medicine | Admitting: Family Medicine

## 2016-04-07 ENCOUNTER — Encounter (HOSPITAL_COMMUNITY): Payer: Self-pay | Admitting: *Deleted

## 2016-04-07 DIAGNOSIS — J111 Influenza due to unidentified influenza virus with other respiratory manifestations: Secondary | ICD-10-CM

## 2016-04-07 DIAGNOSIS — R51 Headache: Secondary | ICD-10-CM

## 2016-04-07 DIAGNOSIS — R0989 Other specified symptoms and signs involving the circulatory and respiratory systems: Secondary | ICD-10-CM | POA: Insufficient documentation

## 2016-04-07 DIAGNOSIS — R69 Illness, unspecified: Secondary | ICD-10-CM

## 2016-04-07 DIAGNOSIS — Z87891 Personal history of nicotine dependence: Secondary | ICD-10-CM | POA: Insufficient documentation

## 2016-04-07 LAB — URINALYSIS, ROUTINE W REFLEX MICROSCOPIC
BILIRUBIN URINE: NEGATIVE
GLUCOSE, UA: NEGATIVE mg/dL
Hgb urine dipstick: NEGATIVE
Ketones, ur: NEGATIVE mg/dL
Leukocytes, UA: NEGATIVE
NITRITE: NEGATIVE
Protein, ur: NEGATIVE mg/dL
SPECIFIC GRAVITY, URINE: 1.017 (ref 1.005–1.030)
pH: 7 (ref 5.0–8.0)

## 2016-04-07 LAB — INFLUENZA PANEL BY PCR (TYPE A & B)
INFLAPCR: NEGATIVE
INFLBPCR: NEGATIVE

## 2016-04-07 LAB — POCT PREGNANCY, URINE
PREG TEST UR: NEGATIVE
Preg Test, Ur: NEGATIVE

## 2016-04-07 LAB — RAPID STREP SCREEN (MED CTR MEBANE ONLY): Streptococcus, Group A Screen (Direct): NEGATIVE

## 2016-04-07 MED ORDER — ACETAMINOPHEN 500 MG PO TABS
1000.0000 mg | ORAL_TABLET | Freq: Once | ORAL | Status: AC
  Administered 2016-04-07: 1000 mg via ORAL
  Filled 2016-04-07: qty 2

## 2016-04-07 NOTE — MAU Provider Note (Signed)
Subjective:    Joy [name] is a 26 y.o. old female here with headache and sinus pressure  HPI Headache and sinus pressure: headache for one week. Sinus pressure for three days. Reports runny nose and congestion for three days. Myalgia for one day. Fever and chills for one day. Home temprature was 101.7.  Denies photophobia, vision changes, neck stiffness, cough, chest pain, shortness of breath, emesis but admits nausea.  Denies sick contact. Her baby's nose is runny a little bit. She has tried clartin and ibuprofen.   Denis smoking.  PMH/Problem List: has Genital herpes and Morbid obesity with BMI of 40.0-44.9, adult (HCC) on her problem list.   has a past medical history of BV (bacterial vaginosis) and Genital herpes.  FH:  No family history on file.  SH Social History  Substance Use Topics  . Smoking status: Former Games developer  . Smokeless tobacco: Not on file  . Alcohol use No    Review of Systems Review of systems negative except for pertinent positives and negatives in history of present illness above.     Objective:     Vitals:   04/07/16 2009  BP: 107/65  Pulse: 99  Resp: (!) 22  Temp: (!) 101.8 F (38.8 C)  TempSrc: Oral  SpO2: 98%  Weight: (!) 305 lb 12 oz (138.7 kg)  Height:  (1.702 m)    Physical Exam GEN: appears well, no apparent distress. Eyes: conjunctiva without injection, sclera anicteric Ears: external ear and ear canal normal Nares: with some congestion or erythema  Oropharynx: mmm without erythema or exudation HEM: positive for cervical lymphadenopathies CVS: RRR, nl S1&S2, no murmurs, cap refills brisk RESP: speaks in full sentence, no IWOB, good air movement bilaterally, CTAB MSK: no focal tenderness or notable swelling. No nuchal rigidity SKIN: no apparent skin lesion  NEURO: alert and oiented appropriately, no gross defecits  PSYCH: euthymic mood with congruent affect    Assessment and Plan:  Viral URI/Influenza like illness:  History  and exam suggestive for viral URTI and influenza like illness. No utility to Tamiflu this late. Appears well for meningitis or pneumonia. She has no meningeal signs or photophobia. Has no respiratory distress, chest pain or shortness of breath to think of pneumonia. She has 3 out 4 Centor's Criteria (fever, sore throat and no cough) which could be concerning for strep pharyngitis. Urine pregnancy test negative.  -Influenza and rapid strep pending. I was notified that the tests has been sent to [facility] cone and takes some times to comeback. Discussed this with patient. She is comfortable going home be notified about the results by phone.  -Recommended conservative management for viral URI and gave handout -Discussed return precautions including but not limited to severe headache, shortness of breath or increased working of breathing, severe persistent cough, persistent fever over 101F, not tolerating fluids by mouth or other symptoms concerning to her.   Orders Placed This Encounter  Procedures  . Rapid strep screen (not at [facility])    Standing Status:   Standing    Number of Occurrences:   1    Order Specific Question:   Patient immune status    Answer:   Normal  . Urinalysis, Routine w reflex microscopic    Standing Status:   Standing    Number of Occurrences:   1  . Influenza panel by PCR (type A & B)    Standing Status:   Standing    Number of Occurrences:   1  . Pregnancy,  urine POC    Standing Status:   Standing    Number of Occurrences:   1  . Pregnancy, urine POC    Standing Status:   Standing    Number of Occurrences:   1    No Follow-up on file.  Almon Hercules, MD 04/07/16 Pager: [phone]   OB FELLOW MAU DISCHARGE ATTESTATION  I have seen and examined this patient; I agree with above documentation in the resident's note. Likely viral URI, will follow up on Strep culture as well as Influenza test. Supportive care.    Jen Mow, DO OB Fellow

## 2016-04-07 NOTE — Discharge Instructions (Signed)
It appears that you have a viral upper respiratory infection (Common Cold).  Cold symptoms can last up to 2 weeks.    - A tablespoonful of honey before bedtime is helpful for cough/sore throat - Get plenty of rest and adequate hydration. - Consume warm fluids (soup or tea) to provide relief for a stuffy nose and to loosen phlegm. - For nasal stuffiness, try saline nasal spray or a Neti Pot. Eating warm liquids such as chicken soup or tea may also help with nasal congestion. - For sore throat pain relief: suck on throat lozenges, hard candy or popsicles; gargle with warm salt water (1/4 tsp. salt per 8 oz. of water); and eat soft, bland foods. - Eat a well-balanced diet. If you cannot, ensure you are getting enough nutrients by taking a daily multivitamin. - Avoid dairy products, as they can thicken phlegm. - Avoid alcohol, as it impairs your bodys immune system.  CONTACT YOUR DOCTOR IF YOU EXPERIENCE ANY OF THE FOLLOWING: - High fever, chest pain, shortness of breath or  not able to keep down food or fluids.  - Cough that gets worse while other cold symptoms improve - Flare up of any chronic lung problem, such as asthma - Your symptoms persist longer than 2 weeks

## 2016-04-07 NOTE — MAU Note (Signed)
PT  SAYS SHE DEL  VAG  ON 07-27-2015-   HAS NOT  HAD CYCLE  SINCE  BC  OF BREASTFEEDING.  BC- ABSTINENCE .        SAYS TEMP  TODAY  WAS 101.7 AND 100.7   - FEEL COL/ HOT, BODY ACHES, LIGHT  HEADED, SINUS  PRESSURE  SORE THROAT-  ALL BEGAN -    ON EASTER- AND  WORSE  NOW.

## 2016-04-10 LAB — CULTURE, GROUP A STREP (THRC)

## 2017-01-05 NOTE — L&D Delivery Note (Addendum)
Patient: Joy [name] MRN: [id number]  GBS status: negative  Patient is a 27 y.o. now G2P2 s/p NSVD at 8229w6d, who was admitted for management of spontaneous labor after PROM. PROM 8h 8942m prior to delivery with clear fluid. Risk factors include history of HSV with recent outbreak resolved and currently on Valtrex. Patient declined a c-section after counseling regarding risks of vaginal delivery.    Delivery Note At 9:33 AM a viable female was delivered via Vaginal, Spontaneous (Presentation: ROA; cephalic).  APGAR: 8, 9; weight pending.   Placenta status: intact, 3 vessel cord. Anesthesia: Epidural  Episiotomy: None Lacerations:  3A Suture Repair: 3.0 vicryl and 2.0 mono Est. Blood Loss (mL): 313    Mom to postpartum.  Baby to Couplet care / Skin to Skin.  Shawn RouteSarah A Peiffer 12/06/2017, 10:09 AM  Head delivered ROA. No nuchal cord present. Shoulder and body delivered in usual fashion. Infant with spontaneous cry, placed on mother's abdomen, dried and bulb suctioned. Cord clamped x 2 after 1-minute delay, and cut by family member. Cord blood drawn. Placenta delivered spontaneously with gentle cord traction. Fundus firm with massage and Pitocin. Perineum inspected and found to have 3A laceration, which was found to be hemostatic and was repaired as above with good hemostasis achieved.  OB FELLOW DELIVERY ATTESTATION  I was gloved and present for the delivery in its entirety, and I agree with the above resident's note.    Gwenevere AbbotNimeka Jamilet Ambroise, MD OB Fellow  12/07/2017, 5:11 AM

## 2017-03-22 ENCOUNTER — Emergency Department (HOSPITAL_COMMUNITY)
Admission: EM | Admit: 2017-03-22 | Discharge: 2017-03-22 | Disposition: A | Discharge status: Home / Self Care | Payer: Medicaid Other | Attending: Emergency Medicine | Admitting: Emergency Medicine

## 2017-03-22 ENCOUNTER — Other Ambulatory Visit: Payer: Self-pay

## 2017-03-22 ENCOUNTER — Encounter (HOSPITAL_COMMUNITY): Payer: Self-pay | Admitting: *Deleted

## 2017-03-22 DIAGNOSIS — H9201 Otalgia, right ear: Secondary | ICD-10-CM | POA: Diagnosis not present

## 2017-03-22 DIAGNOSIS — J069 Acute upper respiratory infection, unspecified: Secondary | ICD-10-CM

## 2017-03-22 DIAGNOSIS — Z87891 Personal history of nicotine dependence: Secondary | ICD-10-CM | POA: Insufficient documentation

## 2017-03-22 DIAGNOSIS — Z79899 Other long term (current) drug therapy: Secondary | ICD-10-CM | POA: Diagnosis not present

## 2017-03-22 MED ORDER — LORATADINE 10 MG PO TABS: 10.0000 mg | ORAL_TABLET | Freq: Every day | ORAL | 0 refills | Status: DC

## 2017-03-22 MED ORDER — NEOMYCIN-POLYMYXIN-HC 3.5-10000-1 OT SUSP: 4.0000 [drp] | Freq: Three times a day (TID) | OTIC | 0 refills | Status: DC

## 2017-03-22 NOTE — ED Provider Notes (Signed)
[facility]ENC[facility] Provider Note   CSN: [id number] Arrival date & time: 03/22/17  [id number]     History   Chief Complaint Chief Complaint  Patient presents with  . Otalgia    HPI Joy [name] is a 27 y.o. female who presents to ED for evaluation of one-week history of gradually improving sinus pressure, congestion and 1 day history of right-sided otalgia.  Describes the otalgia as "throbbing" and feels like she needs to pop her ears.  She has taken over-the-counter Mucinex to help relieve her sinus congestion.  She denies any fevers, hearing changes, trouble breathing or trouble swallowing, sore throat, drainage from the ear, sick contacts with similar symptoms, swelling of the ear.  HPI  Past Medical History:  Diagnosis Date  . BV (bacterial vaginosis)   . Genital herpes     Patient Active Problem List   Diagnosis Date Noted  . Morbid obesity with BMI of 40.0-44.9, adult (HCC) 07/27/2015  . Genital herpes 04/02/2015    Past Surgical History:  Procedure Laterality Date  . NO PAST SURGERIES      OB History    Gravida Para Term Preterm AB Living   1 1 1     1    SAB TAB Ectopic Multiple Live Births         0 1       Home Medications    Prior to Admission medications   Medication Sig Start Date End Date Taking? Authorizing Provider  ibuprofen (ADVIL,MOTRIN) 600 MG tablet Take 1 tablet (600 mg total) by mouth every 6 (six) hours. 07/29/15   Sherre ScarletWilliams, Kimberly, CNM  loratadine (CLARITIN) 10 MG tablet Take 1 tablet (10 mg total) by mouth daily. 03/22/17   Nanako Stopher, PA-C  neomycin-polymyxin-hydrocortisone (CORTISPORIN) 3.5-10000-1 OTIC suspension Place 4 drops into the right ear 3 (three) times daily. 03/22/17   Symon Norwood, Hillary BowHina, PA-C  Prenatal Vit-Fe Fumarate-FA (PRENATAL MULTIVITAMIN) TABS tablet Take 1 tablet by mouth daily at 12 noon.     [provider]    Family History No family history on file.  Social History Social  History   Tobacco Use  . Smoking status: Former Games developermoker  . Smokeless tobacco: Never Used  Substance Use Topics  . Alcohol use: No  . Drug use: Yes    Types: Marijuana     Allergies   Patient has no known allergies.   Review of Systems Review of Systems  Constitutional: Negative for chills and fever.  HENT: Positive for congestion, ear pain, rhinorrhea, sinus pressure and sinus pain. Negative for drooling, ear discharge, hearing loss, nosebleeds, sore throat and tinnitus.   Respiratory: Negative for cough.      Physical Exam Updated Vital Signs BP 120/64 (BP Location: Right Arm)   Pulse 74   Temp 98.6 F (37 C) (Oral)   Resp 20   Ht 5\' 7"  (1.702 m)   Wt 122.9 kg (271 lb)   LMP 02/09/2017   SpO2 98%   BMI 42.44 kg/m   Physical Exam  Constitutional: She appears well-developed and well-nourished. No distress.  HENT:  Head: Normocephalic and atraumatic.  Right Ear: No drainage, swelling or tenderness. No mastoid tenderness. Tympanic membrane is not perforated, not erythematous, not retracted and not bulging. A middle ear effusion is present.  Left Ear: No drainage, swelling or tenderness. No mastoid tenderness. Tympanic membrane is erythematous. Tympanic membrane is not perforated, not retracted and not bulging. A middle ear effusion is present.  Nose: Nose  normal. Right sinus exhibits no maxillary sinus tenderness and no frontal sinus tenderness. Left sinus exhibits no maxillary sinus tenderness and no frontal sinus tenderness.  Mouth/Throat: Uvula is midline, oropharynx is clear and moist and mucous membranes are normal. No trismus in the jaw. No uvula swelling. No tonsillar exudate.  Eyes: Conjunctivae and EOM are normal. No scleral icterus.  Neck: Normal range of motion.  Pulmonary/Chest: Effort normal. No respiratory distress.  Neurological: She is alert.  Skin: No rash noted. She is not diaphoretic.  Psychiatric: She has a normal mood and affect.  Nursing note and  vitals reviewed.    ED Treatments / Results  Labs (all labs ordered are listed, but only abnormal results are displayed) Labs Reviewed - No data to display  EKG  EKG Interpretation None       Radiology No results found.  Procedures Procedures (including critical care time)  Medications Ordered in ED Medications - No data to display   Initial Impression / Assessment and Plan / ED Course  I have reviewed the triage vital signs and the nursing notes.  Pertinent labs & imaging results that were available during my care of the patient were reviewed by me and considered in my medical decision making (see chart for details).     Patient presents to ED for evaluation of sinus pain and pressure for the past week as well as otalgia on the right side for the past day.  Denies any fevers, trouble breathing or trouble swallowing, ear swelling or pain or ear drainage.  On physical exam she is overall well-appearing.  She has no maxillary or frontal sinus tenderness to palpation her symptoms have been going on for 1 week so I do not see a need to treat with antibiotics at this time.  She has no evidence of otitis media, otitis externa, mastoiditis or malignant otitis externa on examination and suspect that her symptoms are viral in nature for this as well.  Will give patient Cortisporin drops as well as antihistamine to help with rhinorrhea.  Patient appears stable for discharge at this time.  Strict return precautions given.  Portions of this note were generated with Scientist, clinical (histocompatibility and immunogenetics). Dictation errors may occur despite best attempts at proofreading.   Final Clinical Impressions(s) / ED Diagnoses   Final diagnoses:  Right ear pain  Viral upper respiratory tract infection    ED Discharge Orders        Ordered    neomycin-polymyxin-hydrocortisone (CORTISPORIN) 3.5-10000-1 OTIC suspension  3 times daily     03/22/17 0957    loratadine (CLARITIN) 10 MG tablet  Daily      03/22/17 0957       Dietrich Pates, PA-C 03/22/17 1000    Azalia Bilis, MD 03/22/17 1535

## 2017-03-22 NOTE — ED Triage Notes (Signed)
Pt states sinus infection x 2 weeks and R ear pain since Friday.

## 2017-04-02 ENCOUNTER — Inpatient Hospital Stay (HOSPITAL_COMMUNITY)
Admission: AD | Admit: 2017-04-02 | Discharge: 2017-04-02 | Disposition: A | Discharge status: Home / Self Care | Payer: Medicaid Other | Source: Ambulatory Visit | Attending: Obstetrics & Gynecology | Admitting: Obstetrics & Gynecology

## 2017-04-02 ENCOUNTER — Other Ambulatory Visit: Payer: Self-pay

## 2017-04-02 ENCOUNTER — Encounter: Payer: Self-pay | Admitting: Student

## 2017-04-02 DIAGNOSIS — Z3A01 Less than 8 weeks gestation of pregnancy: Secondary | ICD-10-CM | POA: Insufficient documentation

## 2017-04-02 DIAGNOSIS — O219 Vomiting of pregnancy, unspecified: Secondary | ICD-10-CM | POA: Diagnosis not present

## 2017-04-02 DIAGNOSIS — Z87891 Personal history of nicotine dependence: Secondary | ICD-10-CM | POA: Diagnosis not present

## 2017-04-02 DIAGNOSIS — O26892 Other specified pregnancy related conditions, second trimester: Secondary | ICD-10-CM | POA: Diagnosis not present

## 2017-04-02 DIAGNOSIS — R109 Unspecified abdominal pain: Secondary | ICD-10-CM | POA: Diagnosis not present

## 2017-04-02 DIAGNOSIS — O210 Mild hyperemesis gravidarum: Secondary | ICD-10-CM | POA: Insufficient documentation

## 2017-04-02 LAB — URINALYSIS, ROUTINE W REFLEX MICROSCOPIC
Bilirubin Urine: NEGATIVE
GLUCOSE, UA: NEGATIVE mg/dL
Hgb urine dipstick: NEGATIVE
Ketones, ur: 20 mg/dL — AB
LEUKOCYTES UA: NEGATIVE
Nitrite: NEGATIVE
PROTEIN: NEGATIVE mg/dL
SPECIFIC GRAVITY, URINE: 1.026 (ref 1.005–1.030)
pH: 6 (ref 5.0–8.0)

## 2017-04-02 LAB — POCT PREGNANCY, URINE: Preg Test, Ur: POSITIVE — AB

## 2017-04-02 MED ORDER — METOCLOPRAMIDE HCL 10 MG PO TABS: 10.0000 mg | ORAL_TABLET | Freq: Four times a day (QID) | ORAL | 0 refills | Status: DC

## 2017-04-02 MED ORDER — METOCLOPRAMIDE HCL 10 MG PO TABS
10.0000 mg | ORAL_TABLET | Freq: Once | ORAL | Status: AC
  Administered 2017-04-02: 10 mg via ORAL
  Filled 2017-04-02: qty 1

## 2017-04-02 NOTE — MAU Note (Signed)
Pt states she took HPT & was positive.  States she has been having symptoms of n/v with mild dull lower abdm ache.  Denies vag bleeding or abnl discharge.

## 2017-04-02 NOTE — MAU Provider Note (Signed)
History     CSN: [id number]  Arrival date and time: 04/02/17 9562   First Provider Initiated Contact with Patient 04/02/17 [phone]      Chief Complaint  Patient presents with  . Nausea   HPI  Joy [name] is a 27 y.o. G2P1001 at [redacted]w[redacted]d who presents with nausea. Symptoms began 1 week ago. Reports positive pregnancy test at home. Reports nausea throughout the the day. Vomits 5 times per day. Had vomited 2 times this morning. Has been able to keep down her breakfast of sausage biscuit and orange juice. Denies abdominal pain, diarrhea, vaginal bleeding. Has not started prenatal care.   OB History    Gravida  2   Para  1   Term  1   Preterm      AB      Living  1     SAB      TAB      Ectopic      Multiple  0   Live Births  1           Past Medical History:  Diagnosis Date  . BV (bacterial vaginosis)   . Genital herpes     Past Surgical History:  Procedure Laterality Date  . WISDOM TOOTH EXTRACTION      No family history on file.  Social History   Tobacco Use  . Smoking status: Former Games developer  . Smokeless tobacco: Never Used  Substance Use Topics  . Alcohol use: Yes    Comment: occassional  . Drug use: Yes    Types: Marijuana    Comment: pt states she quit with this positive HPT    Allergies: No Known Allergies  Medications Prior to Admission  Medication Sig Dispense Refill Last Dose  . ibuprofen (ADVIL,MOTRIN) 600 MG tablet Take 1 tablet (600 mg total) by mouth every 6 (six) hours. 30 tablet 2   . loratadine (CLARITIN) 10 MG tablet Take 1 tablet (10 mg total) by mouth daily. 15 tablet 0   . neomycin-polymyxin-hydrocortisone (CORTISPORIN) 3.5-10000-1 OTIC suspension Place 4 drops into the right ear 3 (three) times daily. 10 mL 0   . Prenatal Vit-Fe Fumarate-FA (PRENATAL MULTIVITAMIN) TABS tablet Take 1 tablet by mouth daily at 12 noon.    Past Week at Unknown time    Review of Systems  Constitutional: Negative.   Gastrointestinal: Positive for  nausea and vomiting. Negative for abdominal pain, constipation and diarrhea.  Genitourinary: Negative.    Physical Exam   Blood pressure 129/66, pulse 69, temperature 98.8 F (37.1 C), temperature source Oral, resp. rate 16, last menstrual period 02/09/2017, SpO2 100 %, unknown if currently breastfeeding.  Physical Exam  Nursing note and vitals reviewed. Constitutional: She is oriented to person, place, and time. She appears well-developed and well-nourished. No distress.  HENT:  Head: Normocephalic and atraumatic.  Eyes: Conjunctivae are normal. Right eye exhibits no discharge. Left eye exhibits no discharge. No scleral icterus.  Neck: Normal range of motion.  Cardiovascular: Normal rate, regular rhythm and normal heart sounds.  No murmur heard. Respiratory: Effort normal and breath sounds normal. No respiratory distress. She has no wheezes.  GI: Soft. There is no tenderness.  Neurological: She is alert and oriented to person, place, and time.  Skin: Skin is warm and dry. She is not diaphoretic.  Psychiatric: She has a normal mood and affect. Her behavior is normal. Judgment and thought content normal.    MAU Course  Procedures Results for orders placed or performed  during the hospital encounter of 04/02/17 (from the past 24 hour(s))  Urinalysis, Routine w reflex microscopic     Status: Abnormal   Collection Time: 04/02/17  9:25 AM  Result Value Ref Range   Color, Urine YELLOW YELLOW   APPearance HAZY (A) CLEAR   Specific Gravity, Urine 1.026 1.005 - 1.030   pH 6.0 5.0 - 8.0   Glucose, UA NEGATIVE NEGATIVE mg/dL   Hgb urine dipstick NEGATIVE NEGATIVE   Bilirubin Urine NEGATIVE NEGATIVE   Ketones, ur 20 (A) NEGATIVE mg/dL   Protein, ur NEGATIVE NEGATIVE mg/dL   Nitrite NEGATIVE NEGATIVE   Leukocytes, UA NEGATIVE NEGATIVE  Pregnancy, urine POC     Status: Abnormal   Collection Time: 04/02/17  9:44 AM  Result Value Ref Range   Preg Test, Ur POSITIVE (A) NEGATIVE     MDM VSS, NAD Reglan 10 mg PO prior to discharge No need to IV fluids at this time Currently denies abdominal pain or vaginal bleeding  Assessment and Plan  A: 1. Nausea and vomiting during pregnancy prior to [redacted] weeks gestation   2. Less than [redacted] weeks gestation of pregnancy    P: Discharge home Rx reglan Start prenatal care Discussed diet choices to help with symptoms   Judeth Hornrin Leanora Murin 04/02/2017, 10:30 AM

## 2017-04-02 NOTE — Discharge Instructions (Signed)
Eating Plan for Hyperemesis Gravidarum Hyperemesis gravidarum is a severe form of morning sickness. Because this condition causes severe nausea and vomiting, it can lead to dehydration, malnutrition, and weight loss. One way to lessen the symptoms of nausea and vomiting is to follow the eating plan for hyperemesis gravidarum. It is often used along with prescribed medicines to control your symptoms. What can I do to relieve my symptoms? Listen to your body. Everyone is different and has different preferences. Find what works best for you. Take any of the following actions that are helpful to you:  Eat and drink slowly.  Eat 5-6 small meals daily instead of 3 large meals.  Eat crackers before you get out of bed in the morning.  Try having a snack in the middle of the night.  Starchy foods are usually tolerated well. Examples include cereal, toast, bread, potatoes, pasta, rice, and pretzels.  Ginger may help with nausea. Add  tsp ground ginger to hot tea or choose ginger tea.  Try drinking 100% fruit juice or an electrolyte drink. An electrolyte drink contains sodium, potassium, and chloride.  Continue to take your prenatal vitamins as told by your health care provider. If you are having trouble taking your prenatal vitamins, talk with your health care provider about different options.  Include at least 1 serving of protein with your meals and snacks. Protein options include meats or poultry, beans, nuts, eggs, and yogurt. Try eating a protein-rich snack before bed. Examples of these snacks include cheese and crackers or half of a peanut butter or [place] sandwich.  Consider eliminating foods that trigger your symptoms. These may include spicy foods, coffee, high-fat foods, very sweet foods, and acidic foods.  Try meals that have more protein combined with bland, salty, lower-fat, and dry foods, such as nuts, seeds, pretzels, crackers, and cereal.  Talk with your healthcare provider about  starting a supplement of vitamin B6.  Have fluids that are cold, clear, and carbonated or sour. Examples include lemonade, ginger ale, lemon-lime soda, ice water, and sparkling water.  Try lemon or mint tea.  Try brushing your teeth or using a mouth rinse after meals.  What should I avoid to reduce my symptoms? Avoiding some of the following things may help reduce your symptoms.  Foods with strong smells. Try eating meals in well-ventilated areas that are free of odors.  Drinking water or other beverages with meals. Try not to drink anything during the 30 minutes before and after your meals.  Drinking more than 1 cup of fluid at a time. Sometimes using a straw helps.  Fried or high-fat foods, such as butter and cream sauces.  Spicy foods.  Skipping meals as best as you can. Nausea can be more intense on an empty stomach. If you cannot tolerate food at that time, do not force it. Try sucking on ice chips or other frozen items, and make up for missed calories later.  Lying down within 2 hours after eating.  Environmental triggers. These may include smoky rooms, closed spaces, rooms with strong smells, warm or humid places, overly loud and noisy rooms, and rooms with motion or flickering lights.  Quick and sudden changes in your movement.  This information is not intended to replace advice given to you by your health care provider. Make sure you discuss any questions you have with your health care provider. Document Released: 10/19/2006 Document Revised: 08/21/2015 Document Reviewed: 07/23/2015 Elsevier Interactive Patient Education  [employer].  [facility]   Center for [facility]       Phone: [phone]  Center for [facility] [place] Phone: [phone]  [facility] for [facility] [place]  Phone: [phone]  [facility] for [facility]  Phone:  [phone]  [facility] for [facility] [place]  Phone: [phone]  [facility]       Phone: [phone]  [facility]    Phone: [phone]   [facility] [place]    Phone: [phone]  [facility]    Phone: [phone]  [facility]    Phone: [phone]   [facility]  Phone: [phone]  [facility]    Phone: [phone]  [facility] [place]   Phone: [phone]  [facility]    Phone: [phone]

## 2017-04-02 NOTE — MAU Provider Note (Addendum)
Chief Complaint: Nausea   First Provider Initiated Contact with Patient 04/02/17 [phone]      SUBJECTIVE HPI: Joy [name] is a 27 y.o. G2P1001 who presents to MAU with a chief complaint of nausea. She states the nausea started about 1 week ago. She took a HPT on 3/19 and it was positive and then took another HPT 1 week later and that was positive as well. States that she is episodically nauseous throughout the day and vomits an average of 5 times per day. She has vomited 2-3 times since waking up today. Denies known triggering factors to the n/v. States she had morning sickness during her first pregnancy but it was not this severe.  She also endorses dull, achy suprapubic pain that is episodic in nature and only aggravated by getting up from a seated or laying position. Denies any fevers, diarrhea, vaginal bleeding, vaginal discharge, dysuria, urinary urgency, urinary frequency, and flank pain.   Past Medical History:  Diagnosis Date  . BV (bacterial vaginosis)   . Genital herpes    Past Surgical History:  Procedure Laterality Date  . WISDOM TOOTH EXTRACTION     Social History   Socioeconomic History  . Marital status: Single    Spouse name: Not on file  . Number of children: Not on file  . Years of education: Not on file  . Highest education level: Not on file  Occupational History  . Not on file  Social Needs  . Financial resource strain: Not on file  . Food insecurity:    Worry: Not on file    Inability: Not on file  . Transportation needs:    Medical: Not on file    Non-medical: Not on file  Tobacco Use  . Smoking status: Former Games developermoker  . Smokeless tobacco: Never Used  Substance and Sexual Activity  . Alcohol use: Yes    Comment: occassional  . Drug use: Yes    Types: Marijuana    Comment: pt states she quit with this positive HPT  . Sexual activity: Yes    Birth control/protection: None  Lifestyle  . Physical activity:    Days per week: Not on file    Minutes per  session: Not on file  . Stress: Not on file  Relationships  . Social connections:    Talks on phone: Not on file    Gets together: Not on file    Attends religious service: Not on file    Active member of club or organization: Not on file    Attends meetings of clubs or organizations: Not on file    Relationship status: Not on file  . Intimate partner violence:    Fear of current or ex partner: Not on file    Emotionally abused: Not on file    Physically abused: Not on file    Forced sexual activity: Not on file  Other Topics Concern  . Not on file  Social History Narrative  . Not on file   No current facility-administered medications on file prior to encounter.    Current Outpatient Medications on File Prior to Encounter  Medication Sig Dispense Refill  . ibuprofen (ADVIL,MOTRIN) 600 MG tablet Take 1 tablet (600 mg total) by mouth every 6 (six) hours. 30 tablet 2  . loratadine (CLARITIN) 10 MG tablet Take 1 tablet (10 mg total) by mouth daily. 15 tablet 0  . neomycin-polymyxin-hydrocortisone (CORTISPORIN) 3.5-10000-1 OTIC suspension Place 4 drops into the right ear 3 (three) times daily. 10  mL 0  . Prenatal Vit-Fe Fumarate-FA (PRENATAL MULTIVITAMIN) TABS tablet Take 1 tablet by mouth daily at 12 noon.      No Known Allergies  ROS:  Review of Systems  Constitutional: Negative for appetite change and fever.  Gastrointestinal: Positive for abdominal pain, nausea and vomiting. Negative for constipation and diarrhea.  Genitourinary: Negative for difficulty urinating, dysuria, flank pain, frequency, urgency, vaginal bleeding and vaginal discharge.     Physical Exam   Patient Vitals for the past 24 hrs:  BP Temp Temp src Pulse Resp SpO2  04/02/17 1019 (!) 119/57 98.3 F (36.8 C) Oral 73 16 100 %   Physical Exam  Constitutional: She is oriented to person, place, and time. She appears well-developed and well-nourished. No distress.  HENT:  Head: Normocephalic and atraumatic.   Cardiovascular: Normal rate and regular rhythm. Exam reveals no gallop and no friction rub.  No murmur heard. Respiratory: Breath sounds normal. She has no wheezes. She has no rales.  GI: Bowel sounds are normal. She exhibits no distension. There is tenderness. There is no rebound and no guarding.  Neurological: She is alert and oriented to person, place, and time.    ASSESSMENT  - nausea in first trimester - first trimester pregnancy -abdominal pain, most likely MSK in nature  PLAN -Discharge home in stable condition -Give PO Reglan for nausea relief -prescribe Reglan for home use -Proof of pregnancy documents provided  -Referral for prenatal care -Told to return to MAU as needed or if her condition were to change or worsen   Shirlee Limerick, Student-PA 04/02/2017 10:19 AM

## 2017-04-05 ENCOUNTER — Inpatient Hospital Stay (HOSPITAL_COMMUNITY): Payer: Medicaid Other

## 2017-04-05 ENCOUNTER — Inpatient Hospital Stay (HOSPITAL_COMMUNITY)
Admission: AD | Admit: 2017-04-05 | Discharge: 2017-04-05 | Disposition: A | Discharge status: Home / Self Care | Payer: Medicaid Other | Source: Ambulatory Visit | Attending: Obstetrics and Gynecology | Admitting: Obstetrics and Gynecology

## 2017-04-05 ENCOUNTER — Encounter (HOSPITAL_COMMUNITY): Payer: Self-pay

## 2017-04-05 DIAGNOSIS — O26851 Spotting complicating pregnancy, first trimester: Secondary | ICD-10-CM | POA: Diagnosis not present

## 2017-04-05 DIAGNOSIS — Z3491 Encounter for supervision of normal pregnancy, unspecified, first trimester: Secondary | ICD-10-CM

## 2017-04-05 DIAGNOSIS — O209 Hemorrhage in early pregnancy, unspecified: Secondary | ICD-10-CM

## 2017-04-05 DIAGNOSIS — O418X10 Other specified disorders of amniotic fluid and membranes, first trimester, not applicable or unspecified: Secondary | ICD-10-CM

## 2017-04-05 DIAGNOSIS — Z3A01 Less than 8 weeks gestation of pregnancy: Secondary | ICD-10-CM | POA: Diagnosis not present

## 2017-04-05 DIAGNOSIS — Z87891 Personal history of nicotine dependence: Secondary | ICD-10-CM | POA: Diagnosis not present

## 2017-04-05 DIAGNOSIS — O200 Threatened abortion: Secondary | ICD-10-CM

## 2017-04-05 DIAGNOSIS — O468X1 Other antepartum hemorrhage, first trimester: Secondary | ICD-10-CM

## 2017-04-05 LAB — CBC
HCT: 38.6 % (ref 36.0–46.0)
Hemoglobin: 13 g/dL (ref 12.0–15.0)
MCH: 29.7 pg (ref 26.0–34.0)
MCHC: 33.7 g/dL (ref 30.0–36.0)
MCV: 88.1 fL (ref 78.0–100.0)
Platelets: 248 10*3/uL (ref 150–400)
RBC: 4.38 MIL/uL (ref 3.87–5.11)
RDW: 14.2 % (ref 11.5–15.5)
WBC: 7.7 10*3/uL (ref 4.0–10.5)

## 2017-04-05 LAB — URINALYSIS, ROUTINE W REFLEX MICROSCOPIC
BILIRUBIN URINE: NEGATIVE
GLUCOSE, UA: NEGATIVE mg/dL
HGB URINE DIPSTICK: NEGATIVE
KETONES UR: 5 mg/dL — AB
Leukocytes, UA: NEGATIVE
Nitrite: NEGATIVE
PROTEIN: NEGATIVE mg/dL
Specific Gravity, Urine: 1.026 (ref 1.005–1.030)
pH: 6 (ref 5.0–8.0)

## 2017-04-05 LAB — WET PREP, GENITAL
Clue Cells Wet Prep HPF POC: NONE SEEN
Sperm: NONE SEEN
Trich, Wet Prep: NONE SEEN
Yeast Wet Prep HPF POC: NONE SEEN

## 2017-04-05 LAB — HCG, QUANTITATIVE, PREGNANCY: HCG, BETA CHAIN, QUANT, S: 30713 m[IU]/mL — AB (ref <5)

## 2017-04-05 NOTE — Discharge Instructions (Signed)
Subchorionic Hematoma °A subchorionic hematoma is a gathering of blood between the outer wall of the placenta and the inner wall of the womb (uterus). The placenta is the organ that connects the fetus to the wall of the uterus. The placenta performs the feeding, breathing (oxygen to the fetus), and waste removal (excretory work) of the fetus. °Subchorionic hematoma is the most common abnormality found on a result from ultrasonography done during the first trimester or early second trimester of pregnancy. If there has been little or no vaginal bleeding, early small hematomas usually shrink on their own and do not affect your baby or pregnancy. The blood is gradually absorbed over 1-2 weeks. When bleeding starts later in pregnancy or the hematoma is larger or occurs in an older pregnant woman, the outcome may not be as good. Larger hematomas may get bigger, which increases the chances for miscarriage. Subchorionic hematoma also increases the risk of premature detachment of the placenta from the uterus, preterm (premature) labor, and stillbirth. °Follow these instructions at home: °· Stay on bed rest if your health care provider recommends this. Although bed rest will not prevent more bleeding or prevent a miscarriage, your health care provider may recommend bed rest until you are advised otherwise. °· Avoid heavy lifting (more than 10 lb [4.5 kg]), exercise, sexual intercourse, or douching as directed by your health care provider. °· Keep track of the number of pads you use each day and how soaked (saturated) they are. Write down this information. °· Do not use tampons. °· Keep all follow-up appointments as directed by your health care provider. Your health care provider may ask you to have follow-up blood tests or ultrasound tests or both. °Get help right away if: °· You have severe cramps in your stomach, back, abdomen, or pelvis. °· You have a fever. °· You pass large clots or tissue. Save any tissue for your  health care provider to look at. °· Your bleeding increases or you become lightheaded, feel weak, or have fainting episodes. °This information is not intended to replace advice given to you by your health care provider. Make sure you discuss any questions you have with your health care provider. °Document Released: 04/08/2006 Document Revised: 05/30/2015 Document Reviewed: 07/21/2012 °Elsevier Interactive Patient Education © 2017 Elsevier Inc. ° °

## 2017-04-05 NOTE — MAU Note (Addendum)
Pt had some spotting, red, pink and then brown, turning back to thicker brown this morning. Intercourse on Friday and that's when it started. No pain.

## 2017-04-05 NOTE — MAU Provider Note (Signed)
History     CSN: [id number]  Arrival date and time: 04/05/17 4098   First Provider Initiated Contact with Patient 04/05/17 1006      Chief Complaint  Patient presents with  . Vaginal Bleeding   HPI  Joy [name] is a 27 y.o. G2P1001 at [redacted]w[redacted]d who presents with vaginal bleeding. Symptoms began Friday after intercourse. Initially was pink spotting on toilet paper, then brown, and now pink again. Is not saturating pads or passing clots. Denies abdominal pain.   OB History    Gravida  2   Para  1   Term  1   Preterm      AB      Living  1     SAB      TAB      Ectopic      Multiple  0   Live Births  1           Past Medical History:  Diagnosis Date  . BV (bacterial vaginosis)   . Genital herpes     Past Surgical History:  Procedure Laterality Date  . WISDOM TOOTH EXTRACTION      History reviewed. No pertinent family history.  Social History   Tobacco Use  . Smoking status: Former Games developer  . Smokeless tobacco: Never Used  Substance Use Topics  . Alcohol use: Not Currently    Comment: quit with positive HPT (03/22/2017)  . Drug use: Yes    Types: Marijuana    Comment: pt states she quit with this positive HPT    Allergies: No Known Allergies  Medications Prior to Admission  Medication Sig Dispense Refill Last Dose  . loratadine (CLARITIN) 10 MG tablet Take 1 tablet (10 mg total) by mouth daily. (Patient taking differently: Take 10 mg by mouth daily as needed. ) 15 tablet 0 Past Week at Unknown time  . metoCLOPramide (REGLAN) 10 MG tablet Take 1 tablet (10 mg total) by mouth every 6 (six) hours. 30 tablet 0 Past Week at Unknown time  . Prenatal Vit-Fe Fumarate-FA (PRENATAL MULTIVITAMIN) TABS tablet Take 1 tablet by mouth daily at 12 noon.    04/04/2017 at Unknown time  . neomycin-polymyxin-hydrocortisone (CORTISPORIN) 3.5-10000-1 OTIC suspension Place 4 drops into the right ear 3 (three) times daily. 10 mL 0     Review of Systems  Constitutional:  Negative.   Gastrointestinal: Positive for nausea. Negative for abdominal pain and vomiting.  Genitourinary: Positive for vaginal bleeding. Negative for vaginal discharge.   Physical Exam   Blood pressure 129/67, pulse 60, temperature 98.9 F (37.2 C), temperature source Oral, resp. rate 16, weight 275 lb 12 oz (125.1 kg), last menstrual period 02/09/2017, SpO2 100 %, unknown if currently breastfeeding.  Physical Exam  Nursing note and vitals reviewed. Constitutional: She is oriented to person, place, and time. She appears well-developed and well-nourished. No distress.  HENT:  Head: Normocephalic and atraumatic.  Eyes: Conjunctivae are normal. Right eye exhibits no discharge. Left eye exhibits no discharge. No scleral icterus.  Neck: Normal range of motion.  Respiratory: Effort normal. No respiratory distress.  GI: Soft.  Genitourinary: Cervix exhibits no motion tenderness and no friability. No bleeding in the vagina. Vaginal discharge found.  Genitourinary Comments: Cervix closed  Neurological: She is alert and oriented to person, place, and time.  Skin: Skin is warm and dry. She is not diaphoretic.  Psychiatric: She has a normal mood and affect. Her behavior is normal. Judgment and thought content normal.  MAU Course  Procedures Results for orders placed or performed during the hospital encounter of 04/05/17 (from the past 24 hour(s))  Urinalysis, Routine w reflex microscopic     Status: Abnormal   Collection Time: 04/05/17  9:43 AM  Result Value Ref Range   Color, Urine YELLOW YELLOW   APPearance CLEAR CLEAR   Specific Gravity, Urine 1.026 1.005 - 1.030   pH 6.0 5.0 - 8.0   Glucose, UA NEGATIVE NEGATIVE mg/dL   Hgb urine dipstick NEGATIVE NEGATIVE   Bilirubin Urine NEGATIVE NEGATIVE   Ketones, ur 5 (A) NEGATIVE mg/dL   Protein, ur NEGATIVE NEGATIVE mg/dL   Nitrite NEGATIVE NEGATIVE   Leukocytes, UA NEGATIVE NEGATIVE  Wet prep, genital     Status: Abnormal   Collection  Time: 04/05/17 10:41 AM  Result Value Ref Range   Yeast Wet Prep HPF POC NONE SEEN NONE SEEN   Trich, Wet Prep NONE SEEN NONE SEEN   Clue Cells Wet Prep HPF POC NONE SEEN NONE SEEN   WBC, Wet Prep HPF POC MANY (A) NONE SEEN   Sperm NONE SEEN   CBC     Status: None   Collection Time: 04/05/17 10:42 AM  Result Value Ref Range   WBC 7.7 4.0 - 10.5 K/uL   RBC 4.38 3.87 - 5.11 MIL/uL   Hemoglobin 13.0 12.0 - 15.0 g/dL   HCT 40.938.6 81.136.0 - 91.446.0 %   MCV 88.1 78.0 - 100.0 fL   MCH 29.7 26.0 - 34.0 pg   MCHC 33.7 30.0 - 36.0 g/dL   RDW 78.214.2 95.611.5 - 21.315.5 %   Platelets 248 150 - 400 K/uL  hCG, quantitative, pregnancy     Status: Abnormal   Collection Time: 04/05/17 10:43 AM  Result Value Ref Range   hCG, Beta Chain, Quant, S 30,713 (H) <5 mIU/mL   [place] Ob Less Than 14 Weeks With Ob Transvaginal  Result Date: 04/05/2017 CLINICAL DATA:  Vaginal bleeding in first trimester pregnancy EXAM: OBSTETRIC <14 WK US AND TRANSVAGINAL OB US TECHNIQUE: Both transabdominal and transvaginal ultrasound examinations were performed for complete evaluation of the gestation as well as the maternal uterus, adnexal regions, and pelvic cul-de-sac. Transvaginal technique was performed to assess early pregnancy. COMPARISON:  None for this gestation FINDINGS: Intrauterine gestational sac: Present, single Yolk sac:  Present Embryo:  Present Cardiac Activity: Present Heart Rate: 101 bpm CRL:  3.4 mm   5 w   6 d                  US EDC: 11/30/2017 Subchorionic hemorrhage:  Probable small subchronic hemorrhage Maternal uterus/adnexae: RIGHT ovary measures 3.7 x 2.3 x 2.9 cm and contains a small hemorrhagic corpus luteum. LEFT ovary normal size and morphology, 2.9 x 1.3 x 2.3 cm. No free pelvic fluid or adnexal masses. IMPRESSION: Single live intrauterine gestation at 5 weeks 6 days EGA by crown-rump length. Probable small subchronic hemorrhage. Electronically Signed   By: Ulyses SouthwardMark  Boles M.D.   On: 04/05/2017 11:37    MDM +UPT UA, wet  prep, GC/chlamydia, CBC, ABO/Rh, quant hCG, HIV, and US today to rule out ectopic pregnancy No blood noted on exam & cervix closed O positive Ultrasound shows SIUP with cardiac activity---- EDD updated. Small SCH   Assessment and Plan  A:  1. Normal IUP (intrauterine pregnancy) on prenatal ultrasound, first trimester   2. Vaginal bleeding in pregnancy, first trimester   3. Subchorionic hematoma in first trimester, single or unspecified fetus  4. Threatened miscarriage    P: Discharge home Pelvic rest Bleeding precautions GC/CT pending Start prenatal care  Heath Badon 04/05/2017, 10:30 AM

## 2017-04-05 NOTE — MAU Provider Note (Signed)
Chief Complaint: Vaginal Bleeding   First Provider Initiated Contact with Patient 04/05/17 1006      SUBJECTIVE HPI: Joy [name] is a 27 y.o. G2P1001 at [redacted]w[redacted]d by LMP who presents to maternity admissions reporting vaginal spotting. She states she had intercourse 04/02/2017 PM then afterwards noted some red streaking on toilet paper. The next morning noted slightly more red bleeding then in the afternoon turned to pink then a "thick mucousy" brown color mixed with some red. Since 04/04/2017 she states it has been "brown and mucuosy globs," only noted on toilet paper when she uses the bathroom. She has had some occasional sharp non-radiating mid abdominal pains that last several seconds and occur 1-3 times a day, occurring mostly on the R but occasionally on the L. Also has had some nausea and occasional vomiting, has been unable to pick up Reglan rx she was given at last MAU visit 04/02/2017.  Aggravating factors: None Alleiviating factors: None She denies vaginal itching/burning, urinary symptoms, h/a, dizziness, or fever/chills.     HPI  Past Medical History:  Diagnosis Date  . BV (bacterial vaginosis)   . Genital herpes    Past Surgical History:  Procedure Laterality Date  . WISDOM TOOTH EXTRACTION     Social History   Socioeconomic History  . Marital status: Single    Spouse name: Not on file  . Number of children: Not on file  . Years of education: Not on file  . Highest education level: Not on file  Occupational History  . Not on file  Social Needs  . Financial resource strain: Not on file  . Food insecurity:    Worry: Not on file    Inability: Not on file  . Transportation needs:    Medical: Not on file    Non-medical: Not on file  Tobacco Use  . Smoking status: Former Games developer  . Smokeless tobacco: Never Used  Substance and Sexual Activity  . Alcohol use: Not Currently    Comment: quit with positive HPT (03/22/2017)  . Drug use: Yes    Types: Marijuana    Comment: pt  states she quit with this positive HPT  . Sexual activity: Yes    Birth control/protection: None  Lifestyle  . Physical activity:    Days per week: Not on file    Minutes per session: Not on file  . Stress: Not on file  Relationships  . Social connections:    Talks on phone: Not on file    Gets together: Not on file    Attends religious service: Not on file    Active member of club or organization: Not on file    Attends meetings of clubs or organizations: Not on file    Relationship status: Not on file  . Intimate partner violence:    Fear of current or ex partner: Not on file    Emotionally abused: Not on file    Physically abused: Not on file    Forced sexual activity: Not on file  Other Topics Concern  . Not on file  Social History Narrative  . Not on file   No current facility-administered medications on file prior to encounter.    Current Outpatient Medications on File Prior to Encounter  Medication Sig Dispense Refill  . loratadine (CLARITIN) 10 MG tablet Take 1 tablet (10 mg total) by mouth daily. (Patient taking differently: Take 10 mg by mouth daily as needed. ) 15 tablet 0  . metoCLOPramide (REGLAN) 10 MG tablet  Take 1 tablet (10 mg total) by mouth every 6 (six) hours. 30 tablet 0  . Prenatal Vit-Fe Fumarate-FA (PRENATAL MULTIVITAMIN) TABS tablet Take 1 tablet by mouth daily at 12 noon.     . neomycin-polymyxin-hydrocortisone (CORTISPORIN) 3.5-10000-1 OTIC suspension Place 4 drops into the right ear 3 (three) times daily. 10 mL 0   No Known Allergies  ROS:  Review of Systems  Constitutional: Negative for chills and fatigue.  Gastrointestinal: Positive for abdominal pain (episodic, mid, R and L, 1-3x a day), nausea and vomiting.  Genitourinary: Positive for vaginal bleeding. Negative for dysuria, frequency, hematuria, urgency and vaginal pain.     I have reviewed patient's Past Medical Hx, Surgical Hx, Family Hx, Social Hx, medications and allergies.    Physical Exam   Patient Vitals for the past 24 hrs:  BP Temp Temp src Pulse Resp SpO2 Weight  04/05/17 0936 129/67 98.9 F (37.2 C) Oral 60 16 100 % 125.1 kg (275 lb 12 oz)   Constitutional: Well-developed, well-nourished female in no acute distress.  Cardiovascular: normal rate Respiratory: normal effort GI: Abd soft, non-tender. Pos BS x 4 MS: Extremities nontender, no edema, normal ROM Neurologic: Alert and oriented x 4.  GU: Neg CVAT.  PELVIC EXAM: Cervix pink, visually closed, without lesion, scant white creamy discharge, vaginal walls and external genitalia normal. No active bleeding noted.  Bimanual exam: Cervix 0/long/high, firm, anterior, neg CMT, uterus nontender, nonenlarged, adnexa without tenderness, enlargement, or mass   LAB RESULTS Results for orders placed or performed during the hospital encounter of 04/05/17 (from the past 24 hour(s))  Urinalysis, Routine w reflex microscopic     Status: Abnormal   Collection Time: 04/05/17  9:43 AM  Result Value Ref Range   Color, Urine YELLOW YELLOW   APPearance CLEAR CLEAR   Specific Gravity, Urine 1.026 1.005 - 1.030   pH 6.0 5.0 - 8.0   Glucose, UA NEGATIVE NEGATIVE mg/dL   Hgb urine dipstick NEGATIVE NEGATIVE   Bilirubin Urine NEGATIVE NEGATIVE   Ketones, ur 5 (A) NEGATIVE mg/dL   Protein, ur NEGATIVE NEGATIVE mg/dL   Nitrite NEGATIVE NEGATIVE   Leukocytes, UA NEGATIVE NEGATIVE  Wet prep, genital     Status: Abnormal   Collection Time: 04/05/17 10:41 AM  Result Value Ref Range   Yeast Wet Prep HPF POC NONE SEEN NONE SEEN   Trich, Wet Prep NONE SEEN NONE SEEN   Clue Cells Wet Prep HPF POC NONE SEEN NONE SEEN   WBC, Wet Prep HPF POC MANY (A) NONE SEEN   Sperm NONE SEEN   CBC     Status: None   Collection Time: 04/05/17 10:42 AM  Result Value Ref Range   WBC 7.7 4.0 - 10.5 K/uL   RBC 4.38 3.87 - 5.11 MIL/uL   Hemoglobin 13.0 12.0 - 15.0 g/dL   HCT 16.1 09.6 - 04.5 %   MCV 88.1 78.0 - 100.0 fL   MCH 29.7  26.0 - 34.0 pg   MCHC 33.7 30.0 - 36.0 g/dL   RDW 40.9 81.1 - 91.4 %   Platelets 248 150 - 400 K/uL       IMAGING US Ob Less Than 14 Weeks With Ob Transvaginal  Result Date: 04/05/2017 CLINICAL DATA:  Vaginal bleeding in first trimester pregnancy EXAM: OBSTETRIC <14 WK [place] AND TRANSVAGINAL OB US TECHNIQUE: Both transabdominal and transvaginal ultrasound examinations were performed for complete evaluation of the gestation as well as the maternal uterus, adnexal regions, and pelvic cul-de-sac. Transvaginal  technique was performed to assess early pregnancy. COMPARISON:  None for this gestation FINDINGS: Intrauterine gestational sac: Present, single Yolk sac:  Present Embryo:  Present Cardiac Activity: Present Heart Rate: 101 bpm CRL:  3.4 mm   5 w   6 d                  US EDC: 11/30/2017 Subchorionic hemorrhage:  Probable small subchronic hemorrhage Maternal uterus/adnexae: RIGHT ovary measures 3.7 x 2.3 x 2.9 cm and contains a small hemorrhagic corpus luteum. LEFT ovary normal size and morphology, 2.9 x 1.3 x 2.3 cm. No free pelvic fluid or adnexal masses. IMPRESSION: Single live intrauterine gestation at 5 weeks 6 days EGA by crown-rump length. Probable small subchronic hemorrhage. Electronically Signed   By: Ulyses SouthwardMark  Boles M.D.   On: 04/05/2017 11:37    MAU Management/MDM: Orders Placed This Encounter  Procedures  . Wet prep, genital  . US OB LESS THAN 14 WEEKS WITH OB TRANSVAGINAL  . Urinalysis, Routine w reflex microscopic  . CBC  . hCG, quantitative, pregnancy  . Discharge patient    No orders of the defined types were placed in this encounter.   Treatments in MAU included none.  ASSESSMENT 1. Normal IUP (intrauterine pregnancy) on prenatal ultrasound, first trimester   2. Vaginal bleeding in pregnancy, first trimester   3. Subchorionic hematoma in first trimester, single or unspecified fetus     PLAN CBC, ABO, bHCG and US to r/o ectopic.  Explained US results to patient.   Discharge home with return precautions for any heavy bleeding.     Allergies as of 04/05/2017   No Known Allergies     Medication List    TAKE these medications   loratadine 10 MG tablet Commonly known as:  CLARITIN Take 1 tablet (10 mg total) by mouth daily. What changed:    when to take this  reasons to take this   metoCLOPramide 10 MG tablet Commonly known as:  REGLAN Take 1 tablet (10 mg total) by mouth every 6 (six) hours.   neomycin-polymyxin-hydrocortisone 3.5-10000-1 OTIC suspension Commonly known as:  CORTISPORIN Place 4 drops into the right ear 3 (three) times daily.   prenatal multivitamin Tabs tablet Take 1 tablet by mouth daily at 12 noon.        Felicie MornHannah E Armin Yerger, Medical Student 04/05/2017  11:50 AM

## 2017-04-06 LAB — GC/CHLAMYDIA PROBE AMP (~~LOC~~) NOT AT ARMC
Chlamydia: NEGATIVE
Neisseria Gonorrhea: NEGATIVE

## 2017-04-24 ENCOUNTER — Other Ambulatory Visit: Payer: Self-pay

## 2017-04-24 ENCOUNTER — Encounter (HOSPITAL_COMMUNITY): Payer: Self-pay

## 2017-04-24 ENCOUNTER — Inpatient Hospital Stay (HOSPITAL_COMMUNITY)
Admission: AD | Admit: 2017-04-24 | Discharge: 2017-04-24 | Disposition: A | Discharge status: Home / Self Care | Payer: Medicaid Other | Source: Ambulatory Visit | Attending: Family Medicine | Admitting: Family Medicine

## 2017-04-24 DIAGNOSIS — Z87891 Personal history of nicotine dependence: Secondary | ICD-10-CM | POA: Diagnosis not present

## 2017-04-24 DIAGNOSIS — R110 Nausea: Secondary | ICD-10-CM | POA: Diagnosis present

## 2017-04-24 DIAGNOSIS — O219 Vomiting of pregnancy, unspecified: Secondary | ICD-10-CM | POA: Diagnosis not present

## 2017-04-24 DIAGNOSIS — Z3A08 8 weeks gestation of pregnancy: Secondary | ICD-10-CM | POA: Insufficient documentation

## 2017-04-24 DIAGNOSIS — O26891 Other specified pregnancy related conditions, first trimester: Secondary | ICD-10-CM | POA: Insufficient documentation

## 2017-04-24 LAB — URINALYSIS, ROUTINE W REFLEX MICROSCOPIC
Bilirubin Urine: NEGATIVE
Glucose, UA: NEGATIVE mg/dL
Hgb urine dipstick: NEGATIVE
Ketones, ur: NEGATIVE mg/dL
Nitrite: NEGATIVE
Protein, ur: 30 mg/dL — AB
Specific Gravity, Urine: 1.029 (ref 1.005–1.030)
pH: 5 (ref 5.0–8.0)

## 2017-04-24 MED ORDER — METOCLOPRAMIDE HCL 10 MG PO TABS: 10.0000 mg | ORAL_TABLET | Freq: Four times a day (QID) | ORAL | 2 refills | Status: DC

## 2017-04-24 MED ORDER — METOCLOPRAMIDE HCL 10 MG PO TABS
10.0000 mg | ORAL_TABLET | Freq: Once | ORAL | Status: AC
  Administered 2017-04-24: 10 mg via ORAL
  Filled 2017-04-24: qty 1

## 2017-04-24 NOTE — MAU Provider Note (Signed)
History     CSN: [id number]  Arrival date and time: 04/24/17 [id number]   First Provider Initiated Contact with Patient 04/24/17 [phone]      Chief Complaint  Patient presents with  . Nausea   HPI  Ms. Joy [name] is a 27 y.o. G2P1001 at 2060w4d who presents to MAU today with complaint of nausea. The patient denies vomiting, diarrhea, abdominal pain, fever or vaginal bleeding. She states that she was getting great relief from Reglan, but ran out on Monday and has been very nauseous since then.    OB History    Gravida  2   Para  1   Term  1   Preterm      AB      Living  1     SAB      TAB      Ectopic      Multiple  0   Live Births  1           Past Medical History:  Diagnosis Date  . BV (bacterial vaginosis)   . Genital herpes     Past Surgical History:  Procedure Laterality Date  . WISDOM TOOTH EXTRACTION      History reviewed. No pertinent family history.  Social History   Tobacco Use  . Smoking status: Former Games developermoker  . Smokeless tobacco: Never Used  Substance Use Topics  . Alcohol use: Not Currently    Comment: quit with positive HPT (03/22/2017)  . Drug use: Yes    Types: Marijuana    Comment: pt states she quit with this positive HPT    Allergies: No Known Allergies  No medications prior to admission.    Review of Systems  Constitutional: Negative for fever.  Gastrointestinal: Positive for nausea. Negative for abdominal pain, constipation, diarrhea and vomiting.  Genitourinary: Negative for vaginal bleeding and vaginal discharge.   Physical Exam   Blood pressure (!) 108/55, pulse 69, temperature 98.4 F (36.9 C), temperature source Oral, resp. rate 18, height 5\' 7"  (1.702 m), weight 266 lb (120.7 kg), last menstrual period 02/09/2017, unknown if currently breastfeeding.  Physical Exam  Nursing note and vitals reviewed. Constitutional: She is oriented to person, place, and time. She appears well-developed and well-nourished. No  distress.  HENT:  Head: Normocephalic and atraumatic.  Cardiovascular: Normal rate, regular rhythm and normal heart sounds.  Respiratory: Effort normal and breath sounds normal. No respiratory distress. She has no wheezes.  GI: Soft. Bowel sounds are normal. She exhibits no distension and no mass. There is no tenderness. There is no rebound and no guarding.  Neurological: She is alert and oriented to person, place, and time.  Skin: Skin is warm and dry. No erythema.  Psychiatric: She has a normal mood and affect.     Results for orders placed or performed during the hospital encounter of 04/24/17 (from the past 24 hour(s))  Urinalysis, Routine w reflex microscopic     Status: Abnormal   Collection Time: 04/24/17  7:43 AM  Result Value Ref Range   Color, Urine YELLOW YELLOW   APPearance HAZY (A) CLEAR   Specific Gravity, Urine 1.029 1.005 - 1.030   pH 5.0 5.0 - 8.0   Glucose, UA NEGATIVE NEGATIVE mg/dL   Hgb urine dipstick NEGATIVE NEGATIVE   Bilirubin Urine NEGATIVE NEGATIVE   Ketones, ur NEGATIVE NEGATIVE mg/dL   Protein, ur 30 (A) NEGATIVE mg/dL   Nitrite NEGATIVE NEGATIVE   Leukocytes, UA TRACE (A) NEGATIVE   RBC /  HPF 0-5 0 - 5 RBC/hpf   WBC, UA 6-30 0 - 5 WBC/hpf   Bacteria, UA FEW (A) NONE SEEN   Squamous Epithelial / LPF 0-5 (A) NONE SEEN   Mucus PRESENT      MAU Course  Procedures None  MDM UA today Reglan given PO challenge - no nausea or emesis in MAU   Assessment and Plan  A: SIUP at [redacted]w[redacted]d Nausea without vomiting   P:  Discharge home Rx for Reglan given to patient with refills First trimester precautions discussed Patient advised to follow-up with [facility] as planned to start prenatal care as soon as possible. Needs to switch to pregnancy MCD ASAP.  Patient may return to MAU as needed or if her condition were to change or worsen   Vonzella Nipple, PA-C 04/24/2017, 9:45 AM

## 2017-04-24 NOTE — Discharge Instructions (Signed)
Eating Plan for Nausea and Vomiting in Pregnancy Morning Sickness Morning sickness is when you feel sick to your stomach (nauseous) during pregnancy. You may feel sick to your stomach and throw up (vomit). You may feel sick in the morning, but you can feel this way any time of day. Some women feel very sick to their stomach and cannot stop throwing up (hyperemesis gravidarum). Follow these instructions at home:  Only take medicines as told by your doctor.  Take multivitamins as told by your doctor. Taking multivitamins before getting pregnant can stop or lessen the harshness of morning sickness.  Eat dry toast or unsalted crackers before getting out of bed.  Eat 5 to 6 small meals a day.  Eat dry and bland foods like rice and baked potatoes.  Do not drink liquids with meals. Drink between meals.  Do not eat greasy, fatty, or spicy foods.  Have someone cook for you if the smell of food causes you to feel sick or throw up.  If you feel sick to your stomach after taking prenatal vitamins, take them at night or with a snack.  Eat protein when you need a snack (nuts, yogurt, cheese).  Eat unsweetened gelatins for dessert.  Wear a bracelet used for sea sickness (acupressure wristband).  Go to a doctor that puts thin needles into certain body points (acupuncture) to improve how you feel.  Do not smoke.  Use a humidifier to keep the air in your house free of odors.  Get lots of fresh air. Contact a doctor if:  You need medicine to feel better.  You feel dizzy or lightheaded.  You are losing weight. Get help right away if:  You feel very sick to your stomach and cannot stop throwing up.  You pass out (faint). This information is not intended to replace advice given to you by your health care provider. Make sure you discuss any questions you have with your health care provider. Document Released: 01/30/2004 Document Revised: 05/30/2015 Document Reviewed: 06/08/2012 Elsevier  Interactive Patient Education  2017 Elsevier Inc.  Hyperemesis gravidarum is a severe form of morning sickness. Because this condition causes severe nausea and vomiting, it can lead to dehydration, malnutrition, and weight loss. One way to lessen the symptoms of nausea and vomiting is to follow the eating plan for hyperemesis gravidarum. It is often used along with prescribed medicines to control your symptoms. What can I do to relieve my symptoms? Listen to your body. Everyone is different and has different preferences. Find what works best for you. Take any of the following actions that are helpful to you:  Eat and drink slowly.  Eat 5-6 small meals daily instead of 3 large meals.  Eat crackers before you get out of bed in the morning.  Try having a snack in the middle of the night.  Starchy foods are usually tolerated well. Examples include cereal, toast, bread, potatoes, pasta, rice, and pretzels.  Ginger may help with nausea. Add  tsp ground ginger to hot tea or choose ginger tea.  Try drinking 100% fruit juice or an electrolyte drink. An electrolyte drink contains sodium, potassium, and chloride.  Continue to take your prenatal vitamins as told by your health care provider. If you are having trouble taking your prenatal vitamins, talk with your health care provider about different options.  Include at least 1 serving of protein with your meals and snacks. Protein options include meats or poultry, beans, nuts, eggs, and yogurt. Try eating a protein-rich  snack before bed. Examples of these snacks include cheese and crackers or half of a peanut butter or [place] sandwich.  Consider eliminating foods that trigger your symptoms. These may include spicy foods, coffee, high-fat foods, very sweet foods, and acidic foods.  Try meals that have more protein combined with bland, salty, lower-fat, and dry foods, such as nuts, seeds, pretzels, crackers, and cereal.  Talk with your healthcare  provider about starting a supplement of vitamin B6.  Have fluids that are cold, clear, and carbonated or sour. Examples include lemonade, ginger ale, lemon-lime soda, ice water, and sparkling water.  Try lemon or mint tea.  Try brushing your teeth or using a mouth rinse after meals.  What should I avoid to reduce my symptoms? Avoiding some of the following things may help reduce your symptoms.  Foods with strong smells. Try eating meals in well-ventilated areas that are free of odors.  Drinking water or other beverages with meals. Try not to drink anything during the 30 minutes before and after your meals.  Drinking more than 1 cup of fluid at a time. Sometimes using a straw helps.  Fried or high-fat foods, such as butter and cream sauces.  Spicy foods.  Skipping meals as best as you can. Nausea can be more intense on an empty stomach. If you cannot tolerate food at that time, do not force it. Try sucking on ice chips or other frozen items, and make up for missed calories later.  Lying down within 2 hours after eating.  Environmental triggers. These may include smoky rooms, closed spaces, rooms with strong smells, warm or humid places, overly loud and noisy rooms, and rooms with motion or flickering lights.  Quick and sudden changes in your movement.  This information is not intended to replace advice given to you by your health care provider. Make sure you discuss any questions you have with your health care provider. Document Released: 10/19/2006 Document Revised: 08/21/2015 Document Reviewed: 07/23/2015 Elsevier Interactive Patient Education  [employer]2018 Elsevier Inc.

## 2017-04-24 NOTE — MAU Note (Signed)
Pt presents to MAU with c/o ongoing nausea with no vomiting. Pt has been taking Reglan for the nausea and has seen relief from medication, but ran out of prescription this week. Pt denies VB and abdominal pain.

## 2017-04-29 ENCOUNTER — Inpatient Hospital Stay (HOSPITAL_COMMUNITY)
Admission: AD | Admit: 2017-04-29 | Discharge: 2017-04-29 | Disposition: A | Discharge status: Home / Self Care | Payer: Medicaid Other | Source: Ambulatory Visit | Attending: Obstetrics & Gynecology | Admitting: Obstetrics & Gynecology

## 2017-04-29 ENCOUNTER — Encounter (HOSPITAL_COMMUNITY): Payer: Self-pay | Admitting: *Deleted

## 2017-04-29 DIAGNOSIS — Z3A09 9 weeks gestation of pregnancy: Secondary | ICD-10-CM | POA: Diagnosis not present

## 2017-04-29 DIAGNOSIS — Z87891 Personal history of nicotine dependence: Secondary | ICD-10-CM | POA: Insufficient documentation

## 2017-04-29 DIAGNOSIS — O210 Mild hyperemesis gravidarum: Secondary | ICD-10-CM

## 2017-04-29 DIAGNOSIS — O26891 Other specified pregnancy related conditions, first trimester: Secondary | ICD-10-CM | POA: Diagnosis present

## 2017-04-29 LAB — URINALYSIS, ROUTINE W REFLEX MICROSCOPIC
BILIRUBIN URINE: NEGATIVE
Glucose, UA: NEGATIVE mg/dL
Hgb urine dipstick: NEGATIVE
KETONES UR: 80 mg/dL — AB
LEUKOCYTES UA: NEGATIVE
Nitrite: NEGATIVE
Protein, ur: 30 mg/dL — AB
SPECIFIC GRAVITY, URINE: 1.028 (ref 1.005–1.030)
pH: 6 (ref 5.0–8.0)

## 2017-04-29 MED ORDER — M.V.I. ADULT IV INJ
Freq: Once | INTRAVENOUS | Status: AC
  Administered 2017-04-29: 13:00:00 via INTRAVENOUS
  Filled 2017-04-29: qty 1000

## 2017-04-29 MED ORDER — RANITIDINE HCL 150 MG PO TABS: 150.0000 mg | ORAL_TABLET | Freq: Two times a day (BID) | ORAL | 0 refills | Status: DC

## 2017-04-29 MED ORDER — PROMETHAZINE HCL 25 MG RE SUPP: 25.0000 mg | Freq: Four times a day (QID) | RECTAL | 1 refills | Status: DC | PRN

## 2017-04-29 MED ORDER — LACTATED RINGERS IV SOLN
25.0000 mg | Freq: Once | INTRAVENOUS | Status: AC
  Administered 2017-04-29: 25 mg via INTRAVENOUS
  Filled 2017-04-29: qty 1

## 2017-04-29 MED ORDER — FAMOTIDINE IN NACL 20-0.9 MG/50ML-% IV SOLN
20.0000 mg | Freq: Once | INTRAVENOUS | Status: AC
  Administered 2017-04-29: 20 mg via INTRAVENOUS
  Filled 2017-04-29: qty 50

## 2017-04-29 NOTE — MAU Note (Signed)
Pt reports she had been having N/V on and off since Tuesday. Can't keep anything down.

## 2017-04-29 NOTE — Discharge Instructions (Signed)

## 2017-04-29 NOTE — MAU Provider Note (Signed)
History     CSN: [id number]  Arrival date and time: 04/29/17 7829   First Provider Initiated Contact with Patient 04/29/17 1103      Chief Complaint  Patient presents with  . Emesis  . Nausea   HPI  Ms.  Joy [name] is a 27 y.o. year old G22P1001 female at [redacted]w[redacted]d weeks gestation who presents to MAU reporting N/V since Wednesday morning. She was attempting to take her Reglan and thrown it up right away. She has not been able to keep anything down. She feels weak. She denies abnormal vaginal d/c, VB, H/A, dizziness, or palpitations.  Past Medical History:  Diagnosis Date  . BV (bacterial vaginosis)   . Genital herpes     Past Surgical History:  Procedure Laterality Date  . WISDOM TOOTH EXTRACTION      History reviewed. No pertinent family history.  Social History   Tobacco Use  . Smoking status: Former Games developer  . Smokeless tobacco: Never Used  Substance Use Topics  . Alcohol use: Not Currently    Comment: quit with positive HPT (03/22/2017)  . Drug use: Yes    Types: Marijuana    Comment: pt states she quit with this positive HPT    Allergies: No Known Allergies  Medications Prior to Admission  Medication Sig Dispense Refill Last Dose  . loratadine (CLARITIN) 10 MG tablet Take 1 tablet (10 mg total) by mouth daily. (Patient taking differently: Take 10 mg by mouth daily as needed. ) 15 tablet 0 Past Week at Unknown time  . metoCLOPramide (REGLAN) 10 MG tablet Take 1 tablet (10 mg total) by mouth every 6 (six) hours. 30 tablet 2 Past Week at Unknown time  . Prenatal Vit-Fe Fumarate-FA (PRENATAL MULTIVITAMIN) TABS tablet Take 1 tablet by mouth daily at 12 noon.    Past Week at Unknown time    Review of Systems  HENT: Positive for congestion, sore throat and trouble swallowing (with eating).   Eyes: Negative.   Respiratory: Positive for shortness of breath ("wants a breathing treatment").   Cardiovascular: Negative.   Gastrointestinal: Negative.   Endocrine:  Negative.   Genitourinary: Positive for dysuria.  Musculoskeletal: Negative.   Skin: Negative.   Allergic/Immunologic: Negative.   Neurological: Negative.   Hematological: Negative.   Psychiatric/Behavioral: Negative.    Physical Exam   Blood pressure (!) 119/52, pulse 63, temperature 98.9 F (37.2 C), resp. rate 18, height 5\' 7"  (1.702 m), weight 259 lb (117.5 kg), last menstrual period 02/09/2017.  Physical Exam  Nursing note and vitals reviewed. Constitutional: She is oriented to person, place, and time. She appears well-developed and well-nourished.  HENT:  Head: Normocephalic and atraumatic.  Eyes: Pupils are equal, round, and reactive to light.  Neck: Normal range of motion.  Cardiovascular: Normal rate, regular rhythm and normal heart sounds.  Respiratory: Effort normal and breath sounds normal.  GI: Soft. Bowel sounds are normal.  Genitourinary:  Genitourinary Comments: Pelvic deferred  Musculoskeletal: Normal range of motion.  Neurological: She is alert and oriented to person, place, and time.  Skin: Skin is warm and dry.  Psychiatric: She has a normal mood and affect. Her behavior is normal. Judgment and thought content normal.    MAU Course  Procedures  MDM CCUA IVFs: Phenergan 25 mg in LR 1000 ml @ 999 ml/hr; then MVI in LR 1000 ml @ 500 ml/hr  PO Challenge -- tolerating gingerale and saltines crackers  Results for orders placed or performed during the hospital encounter of  04/29/17 (from the past 24 hour(s))  Urinalysis, Routine w reflex microscopic     Status: Abnormal   Collection Time: 04/29/17  9:57 AM  Result Value Ref Range   Color, Urine YELLOW YELLOW   APPearance HAZY (A) CLEAR   Specific Gravity, Urine 1.028 1.005 - 1.030   pH 6.0 5.0 - 8.0   Glucose, UA NEGATIVE NEGATIVE mg/dL   Hgb urine dipstick NEGATIVE NEGATIVE   Bilirubin Urine NEGATIVE NEGATIVE   Ketones, ur 80 (A) NEGATIVE mg/dL   Protein, ur 30 (A) NEGATIVE mg/dL   Nitrite NEGATIVE  NEGATIVE   Leukocytes, UA NEGATIVE NEGATIVE   RBC / HPF 0-5 0 - 5 RBC/hpf   WBC, UA 0-5 0 - 5 WBC/hpf   Bacteria, UA RARE (A) NONE SEEN   Squamous Epithelial / LPF 0-5 0 - 5   Mucus PRESENT     Assessment and Plan  Morning sickness - Plan: Discharge patient - Rx for Phenergan 25 mg suppositories pv every 6 hrs prn N/V when Reglan is not working - Rx for Zantac 150 BID  - Continue Reglan as prescribed - Information provided on morning sickness and eating plan for HG - Return to MAU for OB emergencies - Keep NOB appt on 5/29 as scheduled - Patient verbalized an understanding of the plan of care and agrees.   Raelyn Moraolitta Xaviar Lunn, MSN, CNM 04/29/2017, 11:04 AM

## 2017-05-11 ENCOUNTER — Inpatient Hospital Stay (HOSPITAL_COMMUNITY)
Admission: AD | Admit: 2017-05-11 | Discharge: 2017-05-11 | Disposition: A | Discharge status: Home / Self Care | Payer: Medicaid Other | Source: Ambulatory Visit | Attending: Obstetrics & Gynecology | Admitting: Obstetrics & Gynecology

## 2017-05-11 ENCOUNTER — Encounter (HOSPITAL_COMMUNITY): Payer: Self-pay | Admitting: *Deleted

## 2017-05-11 ENCOUNTER — Other Ambulatory Visit: Payer: Self-pay

## 2017-05-11 DIAGNOSIS — Z79899 Other long term (current) drug therapy: Secondary | ICD-10-CM | POA: Insufficient documentation

## 2017-05-11 DIAGNOSIS — Z3A11 11 weeks gestation of pregnancy: Secondary | ICD-10-CM | POA: Diagnosis not present

## 2017-05-11 DIAGNOSIS — O98511 Other viral diseases complicating pregnancy, first trimester: Secondary | ICD-10-CM | POA: Insufficient documentation

## 2017-05-11 DIAGNOSIS — B9689 Other specified bacterial agents as the cause of diseases classified elsewhere: Secondary | ICD-10-CM | POA: Insufficient documentation

## 2017-05-11 DIAGNOSIS — O219 Vomiting of pregnancy, unspecified: Secondary | ICD-10-CM | POA: Diagnosis not present

## 2017-05-11 DIAGNOSIS — B009 Herpesviral infection, unspecified: Secondary | ICD-10-CM | POA: Diagnosis not present

## 2017-05-11 DIAGNOSIS — O210 Mild hyperemesis gravidarum: Secondary | ICD-10-CM | POA: Diagnosis not present

## 2017-05-11 DIAGNOSIS — O23591 Infection of other part of genital tract in pregnancy, first trimester: Secondary | ICD-10-CM | POA: Diagnosis not present

## 2017-05-11 DIAGNOSIS — Z87891 Personal history of nicotine dependence: Secondary | ICD-10-CM | POA: Insufficient documentation

## 2017-05-11 DIAGNOSIS — N760 Acute vaginitis: Secondary | ICD-10-CM | POA: Insufficient documentation

## 2017-05-11 LAB — URINALYSIS, ROUTINE W REFLEX MICROSCOPIC
Bilirubin Urine: NEGATIVE
GLUCOSE, UA: NEGATIVE mg/dL
HGB URINE DIPSTICK: NEGATIVE
KETONES UR: 80 mg/dL — AB
Leukocytes, UA: NEGATIVE
NITRITE: NEGATIVE
PH: 5 (ref 5.0–8.0)
PROTEIN: 100 mg/dL — AB
Specific Gravity, Urine: 1.031 — ABNORMAL HIGH (ref 1.005–1.030)

## 2017-05-11 MED ORDER — SODIUM CHLORIDE 0.9 % IV SOLN
8.0000 mg | Freq: Once | INTRAVENOUS | Status: AC
  Administered 2017-05-11: 8 mg via INTRAVENOUS
  Filled 2017-05-11: qty 4

## 2017-05-11 MED ORDER — ONDANSETRON 8 MG PO TBDP: 8.0000 mg | ORAL_TABLET | Freq: Three times a day (TID) | ORAL | 2 refills | Status: DC | PRN

## 2017-05-11 MED ORDER — LACTATED RINGERS IV BOLUS
1000.0000 mL | Freq: Once | INTRAVENOUS | Status: AC
  Administered 2017-05-11: 1000 mL via INTRAVENOUS

## 2017-05-11 MED ORDER — PROMETHAZINE HCL 25 MG RE SUPP: 25.0000 mg | Freq: Four times a day (QID) | RECTAL | 3 refills | Status: DC | PRN

## 2017-05-11 NOTE — MAU Provider Note (Signed)
History     CSN: [id number]  Arrival date and time: 05/11/17 0757   First Provider Initiated Contact with Patient 05/11/17 [phone]      Chief Complaint  Patient presents with  . Emesis   Joy [name] is a 27 y.o. G2P1001 at [redacted]w[redacted]d who presents today with nausea and vomiting. She was given rx for phenergan suppositories when she was here last, but states that the pharmacist wouldn't fill it because she was also on reglan. Patient states that she has not been able to take or keep down the Reglan for several days, and informed the pharmacist of this as well.   Emesis   This is a new problem. The current episode started 1 to 4 weeks ago. The problem occurs more than 10 times per day. The problem has been unchanged. The emesis has an appearance of stomach contents. There has been no fever. Risk factors: pregnancy  Treatments tried: reglan. The treatment provided no relief.   Past Medical History:  Diagnosis Date  . BV (bacterial vaginosis)   . Genital herpes     Past Surgical History:  Procedure Laterality Date  . WISDOM TOOTH EXTRACTION      History reviewed. No pertinent family history.  Social History   Tobacco Use  . Smoking status: Former Games developer  . Smokeless tobacco: Never Used  Substance Use Topics  . Alcohol use: Not Currently    Comment: quit with positive HPT (03/22/2017)  . Drug use: Yes    Types: Marijuana    Comment: pt states she quit with this positive HPT    Allergies: No Known Allergies  Medications Prior to Admission  Medication Sig Dispense Refill Last Dose  . loratadine (CLARITIN) 10 MG tablet Take 1 tablet (10 mg total) by mouth daily. (Patient taking differently: Take 10 mg by mouth daily as needed. ) 15 tablet 0 prn  . metoCLOPramide (REGLAN) 10 MG tablet Take 1 tablet (10 mg total) by mouth every 6 (six) hours. 30 tablet 2 Past Week at Unknown time  . Prenatal Vit-Fe Fumarate-FA (PRENATAL MULTIVITAMIN) TABS tablet Take 1 tablet by mouth daily at 12 noon.     Past Month at Unknown time  . promethazine (PHENERGAN) 25 MG suppository Place 1 suppository (25 mg total) rectally every 6 (six) hours as needed for nausea or vomiting. 12 each 1   . ranitidine (ZANTAC) 150 MG tablet Take 1 tablet (150 mg total) by mouth 2 (two) times daily. 60 tablet 0     Review of Systems  Gastrointestinal: Positive for vomiting.   Physical Exam   Blood pressure 121/69, pulse 78, temperature 99.2 F (37.3 C), temperature source Oral, resp. rate 18, weight 248 lb (112.5 kg), last menstrual period 02/09/2017, SpO2 100 %, unknown if currently breastfeeding.  Physical Exam  Nursing note and vitals reviewed. Constitutional: She is oriented to person, place, and time. She appears well-developed and well-nourished. No distress.  HENT:  Head: Normocephalic.  Cardiovascular: Normal rate.  Respiratory: Effort normal.  GI: Soft. There is no tenderness.  Genitourinary:  Genitourinary Comments: FHT: 170 with doppler   Neurological: She is alert and oriented to person, place, and time.  Skin: Skin is warm and dry.  Psychiatric: She has a normal mood and affect.   Results for orders placed or performed during the hospital encounter of 05/11/17 (from the past 24 hour(s))  Urinalysis, Routine w reflex microscopic     Status: Abnormal   Collection Time: 05/11/17  8:06 AM  Result  Value Ref Range   Color, Urine AMBER (A) YELLOW   APPearance HAZY (A) CLEAR   Specific Gravity, Urine 1.031 (H) 1.005 - 1.030   pH 5.0 5.0 - 8.0   Glucose, UA NEGATIVE NEGATIVE mg/dL   Hgb urine dipstick NEGATIVE NEGATIVE   Bilirubin Urine NEGATIVE NEGATIVE   Ketones, ur 80 (A) NEGATIVE mg/dL   Protein, ur 161 (A) NEGATIVE mg/dL   Nitrite NEGATIVE NEGATIVE   Leukocytes, UA NEGATIVE NEGATIVE   RBC / HPF 0-5 0 - 5 RBC/hpf   WBC, UA 6-10 0 - 5 WBC/hpf   Bacteria, UA RARE (A) NONE SEEN   Squamous Epithelial / LPF 6-10 0 - 5   Mucus PRESENT     MAU Course  Procedures  MDM  Patient has  had 1L of LR and Zofran IV. She is tolerating PO at this time. DW patient to stop reglan since it is not helping, and attempt to fill rx for phenergan and zofran.   DW patient about R/B of Zofran at this stage of pregnancy. She verbalizes understanding, and wishes to try this medication.   Assessment and Plan   1. Nausea/vomiting in pregnancy   2. Morning sickness   3. [redacted] weeks gestation of pregnancy    DC home Comfort measures reviewed  1st/2nd Trimester precautions  RX: zofran  ODT, New rx for phenergan suppositories sent to pharmacy with a note telling them that she is stopping the reglan.  Return to MAU as needed FU with OB as planned  Follow-up Information    [facility] for [facility] Follow up.   Specialty:  Obstetrics and Gynecology Contact information: [address] [place] [address] [phone]          Thressa Sheller 05/11/2017, 11:22 AM

## 2017-05-11 NOTE — Discharge Instructions (Signed)
Morning Sickness °Morning sickness is when you feel sick to your stomach (nauseous) during pregnancy. This nauseous feeling may or may not come with vomiting. It often occurs in the morning but can be a problem any time of day. Morning sickness is most common during the first trimester, but it may continue throughout pregnancy. While morning sickness is unpleasant, it is usually harmless unless you develop severe and continual vomiting (hyperemesis gravidarum). This condition requires more intense treatment. °What are the causes? °The cause of morning sickness is not completely known but seems to be related to normal hormonal changes that occur in pregnancy. °What increases the risk? °You are at greater risk if you: °· Experienced nausea or vomiting before your pregnancy. °· Had morning sickness during a previous pregnancy. °· Are pregnant with more than one baby, such as twins. ° °How is this treated? °Do not use any medicines (prescription, over-the-counter, or herbal) for morning sickness without first talking to your health care provider. Your health care provider may prescribe or recommend: °· Vitamin B6 supplements. °· Anti-nausea medicines. °· The herbal medicine ginger. ° °Follow these instructions at home: °· Only take over-the-counter or prescription medicines as directed by your health care provider. °· Taking multivitamins before getting pregnant can prevent or decrease the severity of morning sickness in most women. °· Eat a piece of dry toast or unsalted crackers before getting out of bed in the morning. °· Eat five or six small meals a day. °· Eat dry and bland foods (rice, baked potato). Foods high in carbohydrates are often helpful. °· Do not drink liquids with your meals. Drink liquids between meals. °· Avoid greasy, fatty, and spicy foods. °· Get someone to cook for you if the smell of any food causes nausea and vomiting. °· If you feel nauseous after taking prenatal vitamins, take the vitamins at  night or with a snack. °· Snack on protein foods (nuts, yogurt, cheese) between meals if you are hungry. °· Eat unsweetened gelatins for desserts. °· Wearing an acupressure wristband (worn for sea sickness) may be helpful. °· Acupuncture may be helpful. °· Do not smoke. °· Get a humidifier to keep the air in your house free of odors. °· Get plenty of fresh air. °Contact a health care provider if: °· Your home remedies are not working, and you need medicine. °· You feel dizzy or lightheaded. °· You are losing weight. °Get help right away if: °· You have persistent and uncontrolled nausea and vomiting. °· You pass out (faint). °This information is not intended to replace advice given to you by your health care provider. Make sure you discuss any questions you have with your health care provider. °Document Released: 02/12/2006 Document Revised: 05/30/2015 Document Reviewed: 06/08/2012 °Elsevier Interactive Patient Education © 2017 [employer] ° °

## 2017-05-11 NOTE — MAU Note (Signed)
Has been throwing up since Friday.  Went to pick up the other prescription, they wouldn't give it to her said it interacted with the Reglan she is on.hasn't kept any food or fluids down.

## 2017-05-19 ENCOUNTER — Encounter (HOSPITAL_COMMUNITY): Payer: Self-pay | Admitting: *Deleted

## 2017-05-19 ENCOUNTER — Inpatient Hospital Stay (HOSPITAL_COMMUNITY)
Admission: AD | Admit: 2017-05-19 | Discharge: 2017-05-19 | Disposition: A | Discharge status: Home / Self Care | Payer: Medicaid Other | Source: Ambulatory Visit | Attending: Obstetrics and Gynecology | Admitting: Obstetrics and Gynecology

## 2017-05-19 DIAGNOSIS — O26891 Other specified pregnancy related conditions, first trimester: Secondary | ICD-10-CM | POA: Diagnosis not present

## 2017-05-19 DIAGNOSIS — K5900 Constipation, unspecified: Secondary | ICD-10-CM

## 2017-05-19 DIAGNOSIS — R109 Unspecified abdominal pain: Secondary | ICD-10-CM

## 2017-05-19 DIAGNOSIS — Z3A12 12 weeks gestation of pregnancy: Secondary | ICD-10-CM | POA: Diagnosis not present

## 2017-05-19 DIAGNOSIS — O99611 Diseases of the digestive system complicating pregnancy, first trimester: Secondary | ICD-10-CM | POA: Insufficient documentation

## 2017-05-19 DIAGNOSIS — Z87891 Personal history of nicotine dependence: Secondary | ICD-10-CM | POA: Diagnosis not present

## 2017-05-19 DIAGNOSIS — O210 Mild hyperemesis gravidarum: Secondary | ICD-10-CM | POA: Insufficient documentation

## 2017-05-19 DIAGNOSIS — O219 Vomiting of pregnancy, unspecified: Secondary | ICD-10-CM | POA: Diagnosis not present

## 2017-05-19 LAB — URINALYSIS, ROUTINE W REFLEX MICROSCOPIC
Bacteria, UA: NONE SEEN
Bilirubin Urine: NEGATIVE
Glucose, UA: NEGATIVE mg/dL
Hgb urine dipstick: NEGATIVE
Ketones, ur: 20 mg/dL — AB
Leukocytes, UA: NEGATIVE
Nitrite: NEGATIVE
Protein, ur: 30 mg/dL — AB
Specific Gravity, Urine: 1.028 (ref 1.005–1.030)
pH: 5 (ref 5.0–8.0)

## 2017-05-19 MED ORDER — POLYETHYLENE GLYCOL 3350 17 GM/SCOOP PO POWD: 1.0000 | Freq: Once | ORAL | 0 refills | Status: AC

## 2017-05-19 NOTE — MAU Provider Note (Signed)
History     CSN: [id number]  Arrival date and time: 05/19/17 1248   First Provider Initiated Contact with Patient 05/19/17 1337      Chief Complaint  Patient presents with  . Abdominal Pain   Abdominal Pain  This is a new problem. The current episode started in the past 7 days. The onset quality is gradual. The problem occurs intermittently. The problem has been gradually worsening. The pain is located in the generalized abdominal region. The pain is mild. The quality of the pain is aching and a sensation of fullness. The abdominal pain radiates to the periumbilical region. Associated symptoms include constipation and nausea. Pertinent negatives include no dysuria.     [name] is a 27 y.o. female G2P1001 @ [redacted]w[redacted]d here in MAU with abdominal pain. Says the pain started last Thursday. The pain is locate in the top of her abdomen and at times radiates down to her lower abdomen. The pain comes and goes. Says she has not taken any medication for the pain. Thinks it is gas pain. + constipation. Last normal BM was " I can't remember". I had a BM Saturday however it was not large, and she did not have any relief.    OB History    Gravida  2   Para  1   Term  1   Preterm      AB      Living  1     SAB      TAB      Ectopic      Multiple  0   Live Births  1           Past Medical History:  Diagnosis Date  . BV (bacterial vaginosis)   . Genital herpes     Past Surgical History:  Procedure Laterality Date  . WISDOM TOOTH EXTRACTION      History reviewed. No pertinent family history.  Social History   Tobacco Use  . Smoking status: Former Games developer  . Smokeless tobacco: Never Used  Substance Use Topics  . Alcohol use: Not Currently    Comment: quit with positive HPT (03/22/2017)  . Drug use: Yes    Types: Marijuana    Comment: pt states she quit with this positive HPT    Allergies: No Known Allergies  Medications Prior to Admission  Medication Sig  Dispense Refill Last Dose  . loratadine (CLARITIN) 10 MG tablet Take 1 tablet (10 mg total) by mouth daily. (Patient taking differently: Take 10 mg by mouth daily as needed. ) 15 tablet 0 prn  . ondansetron (ZOFRAN ODT) 8 MG disintegrating tablet Take 1 tablet (8 mg total) by mouth every 8 (eight) hours as needed for nausea or vomiting. 20 tablet 2   . Prenatal Vit-Fe Fumarate-FA (PRENATAL MULTIVITAMIN) TABS tablet Take 1 tablet by mouth daily at 12 noon.    Past Month at Unknown time  . promethazine (PHENERGAN) 25 MG suppository Place 1 suppository (25 mg total) rectally every 6 (six) hours as needed for nausea or vomiting. 12 each 3   . ranitidine (ZANTAC) 150 MG tablet Take 1 tablet (150 mg total) by mouth 2 (two) times daily. 60 tablet 0    Results for orders placed or performed during the hospital encounter of 05/19/17 (from the past 48 hour(s))  Urinalysis, Routine w reflex microscopic     Status: Abnormal   Collection Time: 05/19/17 12:55 PM  Result Value Ref Range   Color, Urine YELLOW YELLOW  APPearance HAZY (A) CLEAR   Specific Gravity, Urine 1.028 1.005 - 1.030   pH 5.0 5.0 - 8.0   Glucose, UA NEGATIVE NEGATIVE mg/dL   Hgb urine dipstick NEGATIVE NEGATIVE   Bilirubin Urine NEGATIVE NEGATIVE   Ketones, ur 20 (A) NEGATIVE mg/dL   Protein, ur 30 (A) NEGATIVE mg/dL   Nitrite NEGATIVE NEGATIVE   Leukocytes, UA NEGATIVE NEGATIVE   RBC / HPF 0-5 0 - 5 RBC/hpf   WBC, UA 0-5 0 - 5 WBC/hpf   Bacteria, UA NONE SEEN NONE SEEN   Squamous Epithelial / LPF 0-5 0 - 5   Mucus PRESENT     Comment: Performed at [facility], [address] [place], [place] [address]   Review of Systems  Gastrointestinal: Positive for abdominal pain, constipation and nausea.  Genitourinary: Negative for dysuria.   Physical Exam   Blood pressure (!) 104/42, pulse 64, temperature 98.2 F (36.8 C), temperature source Oral, resp. rate 19, height  (1.702 m), weight 253 lb (114.8 kg), last menstrual  period 02/09/2017, SpO2 100 %, unknown if currently breastfeeding.  Physical Exam  Constitutional: She is oriented to person, place, and time. She appears well-developed and well-nourished. No distress.  HENT:  Head: Normocephalic.  Eyes: Pupils are equal, round, and reactive to light.  Respiratory: Effort normal.  GI: Soft. She exhibits no distension. There is no tenderness. There is no rebound and no guarding.  Genitourinary:  Genitourinary Comments: Cervix closed, posterior   Neurological: She is alert and oriented to person, place, and time.  Skin: Skin is warm. She is not diaphoretic.  Psychiatric: Her behavior is normal.   MAU Course  Procedures  None  MDM  UA + fetal heart tones via doppler   Assessment and Plan   A:  1. Abdominal pain in pregnancy, first trimester   2. Morning sickness   3. Constipation, unspecified constipation type     P:  Discharge home in stable condition Rx: Miralax: miralax clean out instructions provided Increase oral fluid intake Stop Zofran for now Patient has RX for phenergan Return to MAU if symptoms worsen   Rasch, Harolyn Rutherford, NP 05/19/2017 4:26 PM

## 2017-05-19 NOTE — MAU Provider Note (Signed)
  History     CSN: [id number]  Arrival date and time: 05/19/17 1248   None     No chief complaint on file.  HPI   [name] is a 27 y.o. female [redacted]w[redacted]d   OB History    Gravida  2   Para  1   Term  1   Preterm      AB      Living  1     SAB      TAB      Ectopic      Multiple  0   Live Births  1           Past Medical History:  Diagnosis Date  . BV (bacterial vaginosis)   . Genital herpes     Past Surgical History:  Procedure Laterality Date  . WISDOM TOOTH EXTRACTION      No family history on file.  Social History   Tobacco Use  . Smoking status: Former Games developer  . Smokeless tobacco: Never Used  Substance Use Topics  . Alcohol use: Not Currently    Comment: quit with positive HPT (03/22/2017)  . Drug use: Yes    Types: Marijuana    Comment: pt states she quit with this positive HPT    Allergies: No Known Allergies  Medications Prior to Admission  Medication Sig Dispense Refill Last Dose  . loratadine (CLARITIN) 10 MG tablet Take 1 tablet (10 mg total) by mouth daily. (Patient taking differently: Take 10 mg by mouth daily as needed. ) 15 tablet 0 prn  . ondansetron (ZOFRAN ODT) 8 MG disintegrating tablet Take 1 tablet (8 mg total) by mouth every 8 (eight) hours as needed for nausea or vomiting. 20 tablet 2   . Prenatal Vit-Fe Fumarate-FA (PRENATAL MULTIVITAMIN) TABS tablet Take 1 tablet by mouth daily at 12 noon.    Past Month at Unknown time  . promethazine (PHENERGAN) 25 MG suppository Place 1 suppository (25 mg total) rectally every 6 (six) hours as needed for nausea or vomiting. 12 each 3   . ranitidine (ZANTAC) 150 MG tablet Take 1 tablet (150 mg total) by mouth 2 (two) times daily. 60 tablet 0     Review of Systems Physical Exam   Last menstrual period 02/09/2017, unknown if currently breastfeeding.  Physical Exam  MAU Course  Procedures  MDM   Assessment and Plan           GYNECOLOGY CLINIC PROCEDURE  NOTE    IUD Insertion Procedure Note Patient identified, informed consent performed, consent signed.   Discussed risks of irregular bleeding, cramping, infection, malpositioning or misplacement of the IUD outside the uterus which may require further procedure such as laparoscopy. Time out was performed.  Urine pregnancy test negative.  Speculum placed in the vagina.  Cervix visualized.  Cleaned with Betadine x 2.  Grasped anteriorly with a single tooth tenaculum.  Uterus sounded to  cm.  Mirena IUD placed per manufacturer's recommendations.  Strings trimmed to 3 cm. Tenaculum was removed, good hemostasis noted.  Patient tolerated procedure well.   Patient was given post-procedure instructions.  She was advised to have backup contraception for one week.  Patient was also asked to check IUD strings periodically and follow up in 4 weeks for IUD check.      Joy [name] 05/19/2017, 1:14 PM

## 2017-05-19 NOTE — MAU Note (Signed)
Pt presents with complaint of cramping x last 6 days, worse at night. No BM x last 4 days. Nausea, no vomiting. Denies vaginal bleeding

## 2017-05-19 NOTE — Discharge Instructions (Signed)

## 2017-06-02 ENCOUNTER — Ambulatory Visit (INDEPENDENT_AMBULATORY_CARE_PROVIDER_SITE_OTHER): Payer: Medicaid Other | Admitting: Medical

## 2017-06-02 ENCOUNTER — Encounter: Payer: Self-pay | Admitting: *Deleted

## 2017-06-02 ENCOUNTER — Encounter: Payer: Self-pay | Admitting: Medical

## 2017-06-02 VITALS — BP 95/57 | HR 75 | Wt 257.2 lb

## 2017-06-02 DIAGNOSIS — Z3481 Encounter for supervision of other normal pregnancy, first trimester: Secondary | ICD-10-CM | POA: Diagnosis not present

## 2017-06-02 DIAGNOSIS — Z3480 Encounter for supervision of other normal pregnancy, unspecified trimester: Secondary | ICD-10-CM | POA: Insufficient documentation

## 2017-06-02 DIAGNOSIS — O99210 Obesity complicating pregnancy, unspecified trimester: Secondary | ICD-10-CM | POA: Insufficient documentation

## 2017-06-02 DIAGNOSIS — A6000 Herpesviral infection of urogenital system, unspecified: Secondary | ICD-10-CM

## 2017-06-02 DIAGNOSIS — O99211 Obesity complicating pregnancy, first trimester: Secondary | ICD-10-CM

## 2017-06-02 NOTE — Progress Notes (Signed)
PMH form completed.

## 2017-06-02 NOTE — Progress Notes (Signed)
   PRENATAL VISIT NOTE  Subjective:  Joy [name] is a 27 y.o. G2P1001 at [redacted]w[redacted]d being seen today for ongoing prenatal care.  She is currently monitored for the following issues for this low-risk pregnancy and has Genital herpes; Morning sickness; Supervision of other normal pregnancy, antepartum; and Obesity in pregnancy, antepartum on their problem list.  Patient reports no complaints.  Contractions: Not present. Vag. Bleeding: None.  Movement: Present. Denies leaking of fluid.   The following portions of the patient's history were reviewed and updated as appropriate: allergies, current medications, past family history, past medical history, past social history, past surgical history and problem list. Problem list updated.  Objective:   Vitals:   06/02/17 0837  BP: (!) 95/57  Pulse: 75  Weight: 257 lb 3.2 oz (116.7 kg)    Fetal Status: Fetal Heart Rate (bpm): 164   Movement: Present     General:  Alert, oriented and cooperative. Patient is in no acute distress.  Skin: Skin is warm and dry. No rash noted.   Cardiovascular: Normal heart rate and rhythm noted  Respiratory: Normal respiratory effort, no problems with respiration noted. Clear to auscultation.   Abdomen: Soft, gravid, appropriate for gestational age. Normal bowel sounds. No tenderness to palpation  Pain/Pressure: Present     Pelvic: Cervical exam deferred        Extremities: Normal range of motion.  Edema: None  Mental Status: Normal mood and affect. Normal behavior. Normal judgment and thought content.   Assessment and Plan:  Pregnancy: G2P1001 at [redacted]w[redacted]d  1. Supervision of other normal pregnancy, antepartum - Obstetric Panel, Including HIV - Culture, OB Urine - Hemoglobin A1c - [facility] AMB BABYSCRIPTS OPT IN - Hemoglobinopathy Evaluation - [place] MFM OB DETAIL +14 WK; scheduled   2. Genital herpes simplex, unspecified site - Will need suppressive therapy at 36 weeks, patient aware  3. Obesity in pregnancy,  antepartum - Advised to start 81 mg ASA daily until 36 weeks  4. Nausea and vomiting in pregnancy - Patient has had multiple MAU visits for this, symptoms have improved significantly with Zofran - Using Zofran PRN  - 22# weight loss recorded at NOB based on patient's reported pre-pregnancy weight  Preterm labor/second trimester symptoms and general obstetric precautions including but not limited to vaginal bleeding, contractions, leaking of fluid and fetal movement were reviewed in detail with the patient. Please refer to After Visit Summary for other counseling recommendations.  Return in about 1 month (around 06/30/2017) for LOB.  Future Appointments  Date Time Provider Department Center  07/06/2017 10:00 AM [facility] [place] 3 [facility]    Vonzella Nipple, PA-C

## 2017-06-02 NOTE — Progress Notes (Signed)
C/o lower back back pain. Declined flu shot. Declined Bab[employer] optimization, accepted app. Already has MyChart. Declines SMA test and declines genetic testing.

## 2017-06-02 NOTE — Patient Instructions (Signed)
Prenatal Care WHAT IS PRENATAL CARE? Prenatal care is the process of caring for a pregnant woman before she gives birth. Prenatal care makes sure that she and her baby remain as healthy as possible throughout pregnancy. Prenatal care may be provided by a midwife, family practice health care provider, or a childbirth and pregnancy specialist (obstetrician). Prenatal care may include physical examinations, testing, treatments, and education on nutrition, lifestyle, and social support services. WHY IS PRENATAL CARE SO IMPORTANT? Early and consistent prenatal care increases the chance that you and your baby will remain healthy throughout your pregnancy. This type of care also decreases a baby's risk of being born too early (prematurely), or being born smaller than expected (small for gestational age). Any underlying medical conditions you may have that could pose a risk during your pregnancy are discussed during prenatal care visits. You will also be monitored regularly for any new conditions that may arise during your pregnancy so they can be treated quickly and effectively. WHAT HAPPENS DURING PRENATAL CARE VISITS? Prenatal care visits may include the following: Discussion Tell your health care provider about any new signs or symptoms you have experienced since your last visit. These might include:  Nausea or vomiting.  Increased or decreased level of energy.  Difficulty sleeping.  Back or leg pain.  Weight changes.  Frequent urination.  Shortness of breath with physical activity.  Changes in your skin, such as the development of a rash or itchiness.  Vaginal discharge or bleeding.  Feelings of excitement or nervousness.  Changes in your baby's movements.  You may want to write down any questions or topics you want to discuss with your health care provider and bring them with you to your appointment. Examination During your first prenatal care visit, you will likely have a complete  physical exam. Your health care provider will often examine your vagina, cervix, and the position of your uterus, as well as check your heart, lungs, and other body systems. As your pregnancy progresses, your health care provider will measure the size of your uterus and your baby's position inside your uterus. He or she may also examine you for early signs of labor. Your prenatal visits may also include checking your blood pressure and, after about 10-12 weeks of pregnancy, listening to your baby's heartbeat. Testing Regular testing often includes:  Urinalysis. This checks your urine for glucose, protein, or signs of infection.  Blood count. This checks the levels of white and red blood cells in your body.  Tests for sexually transmitted infections (STIs). Testing for STIs at the beginning of pregnancy is routinely done and is required in many states.  Antibody testing. You will be checked to see if you are immune to certain illnesses, such as rubella, that can affect a developing fetus.  Glucose screen. Around 24-28 weeks of pregnancy, your blood glucose level will be checked for signs of gestational diabetes. Follow-up tests may be recommended.  Group B strep. This is a bacteria that is commonly found inside a woman's vagina. This test will inform your health care provider if you need an antibiotic to reduce the amount of this bacteria in your body prior to labor and childbirth.  Ultrasound. Many pregnant women undergo an ultrasound screening around 18-20 weeks of pregnancy to evaluate the health of the fetus and check for any developmental abnormalities.  HIV (human immunodeficiency virus) testing. Early in your pregnancy, you will be screened for HIV. If you are at high risk for HIV, this test may   be repeated during your third trimester of pregnancy.  You may be offered other testing based on your age, personal or family medical history, or other factors. HOW OFTEN SHOULD I PLAN TO SEE MY  HEALTH CARE PROVIDER FOR PRENATAL CARE? Your prenatal care check-up schedule depends on any medical conditions you have before, or develop during, your pregnancy. If you do not have any underlying medical conditions, you will likely be seen for checkups:  Monthly, during the first 6 months of pregnancy.  Twice a month during months 7 and 8 of pregnancy.  Weekly starting in the 9th month of pregnancy and until delivery.  If you develop signs of early labor or other concerning signs or symptoms, you may need to see your health care provider more often. Ask your health care provider what prenatal care schedule is best for you. WHAT CAN I DO TO KEEP MYSELF AND MY BABY AS HEALTHY AS POSSIBLE DURING MY PREGNANCY?  Take a prenatal vitamin containing 400 micrograms (0.4 mg) of folic acid every day. Your health care provider may also ask you to take additional vitamins such as iodine, vitamin D, iron, copper, and zinc.  Take 1500-2000 mg of calcium daily starting at your 20th week of pregnancy until you deliver your baby.  Make sure you are up to date on your vaccinations. Unless directed otherwise by your health care provider: ? You should receive a tetanus, diphtheria, and pertussis (Tdap) vaccination between the 27th and 36th week of your pregnancy, regardless of when your last Tdap immunization occurred. This helps protect your baby from whooping cough (pertussis) after he or she is born. ? You should receive an annual inactivated influenza vaccine (IIV) to help protect you and your baby from influenza. This can be done at any point during your pregnancy.  Eat a well-rounded diet that includes: ? Fresh fruits and vegetables. ? Lean proteins. ? Calcium-rich foods such as milk, yogurt, hard cheeses, and dark, leafy greens. ? Whole grain breads.  Do noteat seafood high in mercury, including: ? Swordfish. ? Tilefish. ? Shark. ? King mackerel. ? More than 6 oz tuna per week.  Do not  eat: ? Raw or undercooked meats or eggs. ? Unpasteurized foods, such as soft cheeses (brie, blue, or feta), juices, and milks. ? Lunch meats. ? Hot dogs that have not been heated until they are steaming.  Drink enough water to keep your urine clear or pale yellow. For many women, this may be 10 or more 8 oz glasses of water each day. Keeping yourself hydrated helps deliver nutrients to your baby and may prevent the start of pre-term uterine contractions.  Do not use any tobacco products including cigarettes, chewing tobacco, or electronic cigarettes. If you need help quitting, ask your health care provider.  Do not drink beverages containing alcohol. No safe level of alcohol consumption during pregnancy has been determined.  Do not use any illegal drugs. These can harm your developing baby or cause a miscarriage.  Ask your health care provider or pharmacist before taking any prescription or over-the-counter medicines, herbs, or supplements.  Limit your caffeine intake to no more than 200 mg per day.  Exercise. Unless told otherwise by your health care provider, try to get 30 minutes of moderate exercise most days of the week. Do not  do high-impact activities, contact sports, or activities with a high risk of falling, such as horseback riding or downhill skiing.  Get plenty of rest.  Avoid anything that raises your  body temperature, such as hot tubs and saunas.  If you own a cat, do not empty its litter box. Bacteria contained in cat feces can cause an infection called toxoplasmosis. This can result in serious harm to the fetus.  Stay away from chemicals such as insecticides, lead, mercury, and cleaning or paint products that contain solvents.  Do not have any X-rays taken unless medically necessary.  Take a childbirth and breastfeeding preparation class. Ask your health care provider if you need a referral or recommendation.  This information is not intended to replace advice given  to you by your health care provider. Make sure you discuss any questions you have with your health care provider. Document Released: 12/25/2002 Document Revised: 05/27/2015 Document Reviewed: 03/08/2013 Elsevier Interactive Patient Education  2017 [place] Education Options: [facility] Department Classes:  Childbirth education classes can help you get ready for a positive parenting experience. You can also meet other expectant parents and get free stuff for your baby. Each class runs for five weeks on the same night and costs $45 for the mother-to-be and her support person. Medicaid covers the cost if you are eligible. Call [phone] to register. [facility] Childbirth Education:  [phone] or [phone] or [email or web address]  Baby & Me Class: Discuss newborn & infant parenting and family adjustment issues with other new mothers in a relaxed environment. Each week brings a new speaker or baby-centered activity. We encourage new mothers to join [place] every Thursday at 11:00am. Babies birth until crawling. No registration or fee. Daddy [employer]: This course offers Dads-to-be the tools and knowledge needed to feel confident on their journey to becoming new fathers. Experienced dads, who have been trained as coaches, teach dads-to-be how to hold, comfort, diaper, swaddle and play with their infant while being able to support the new mom as well. A class for men taught by men. $25/dad Big Brother/Big Sister: Let your children share in the joy of a new brother or sister in this special class designed just for them. Class includes discussion about how families care for babies: swaddling, holding, diapering, safety as well as how they can be helpful in their new role. This class is designed for children ages 43 to 60, but any age is welcome. Please register each child individually. $5/child  Mom Talk: This mom-led group offers support and connection to mothers as  they journey through the adjustments and struggles of that sometimes overwhelming first year after the birth of a child. Tuesdays at 10:00am and Thursdays at 6:00pm. Babies welcome. No registration or fee. Breastfeeding Support Group: This group is a mother-to-mother support circle where moms have the opportunity to share their breastfeeding experiences. A Lactation Consultant is present for questions and concerns. Meets each Tuesday at 11:00am. No fee or registration. Breastfeeding Your Baby: Learn what to expect in the first days of breastfeeding your newborn.  This class will help you feel more confident with the skills needed to begin your breastfeeding experience. Many new mothers are concerned about breastfeeding after leaving the hospital. This class will also address the most common fears and challenges about breastfeeding during the first few weeks, months and beyond. (call for fee) Comfort Techniques and Tour: This 2 hour interactive class will provide you the opportunity to learn & practice hands-on techniques that can help relieve some of the discomfort of labor and encourage your baby to rotate toward the best position for birth. You and your partner will be able to try  a variety of labor positions with birth balls and rebozos as well as practice breathing, relaxation, and visualization techniques. A tour of the [facility] is included with this class. $20 per registrant and support person Childbirth Class- Weekend Option: This class is a Weekend version of our Birth & Baby series. It is designed for parents who have a difficult time fitting several weeks of classes into their schedule. It covers the care of your newborn and the basics of labor and childbirth. It also includes a [place] [facility] and lunch. The class is held two consecutive days: beginning on Friday evening from 6:30 - 8:30 p.m. and the next day, Saturday from 9 a.m. - 4 p.m.  (call for fee) Doren Custard Class: Interested in a waterbirth?  This informational class will help you discover whether waterbirth is the right fit for you. Education about waterbirth itself, supplies you would need and how to assemble your support team is what you can expect from this class. Some obstetrical practices require this class in order to pursue a waterbirth. (Not all obstetrical practices offer waterbirth-check with your healthcare provider.) Register only the expectant mom, but you are encouraged to bring your partner to class! Required if planning waterbirth, no fee. Infant/Child CPR: Parents, grandparents, babysitters, and friends learn Cardio-Pulmonary Resuscitation skills for infants and children. You will also learn how to treat both conscious and unconscious choking in infants and children. This Family & Friends program does not offer certification. Register each participant individually to ensure that enough mannequins are available. (Call for fee) Grandparent Love: Expecting a grandbaby? This class is for you! Learn about the latest infant care and safety recommendations and ways to support your own child as he or she transitions into the parenting role. Taught by Registered Nurses who are childbirth instructors, but most importantly...they are grandmothers too! $10/person. Childbirth Class- Natural Childbirth: This series of 5 weekly classes is for expectant parents who want to learn and practice natural methods of coping with the process of labor and childbirth. Relaxation, breathing, massage, visualization, role of the partner, and helpful positioning are highlighted. Participants learn how to be confident in their body's ability to give birth. This class will empower and help parents make informed decisions about their own care. Includes discussion that will help new parents transition into the immediate postpartum period. [facility] is included. We  suggest taking this class between 25-32 weeks, but it's only a recommendation. $75 per registrant and one support person or $30 Medicaid. Childbirth Class- 3 week Series: This option of 3 weekly classes helps you and your labor partner prepare for childbirth. Newborn care, labor & birth, cesarean birth, pain management, and comfort techniques are discussed and a [place] [facility] is included. The class meets at the same time, on the same day of the week for 3 consecutive weeks beginning with the starting date you choose. $60 for registrant and one support person.  Marvelous Multiples: Expecting twins, triplets, or more? This class covers the differences in labor, birth, parenting, and breastfeeding issues that face multiples' parents. NICU tour is included. Led by a Certified Childbirth Educator who is the mother of twins. No fee. Caring for Baby: This class is for expectant and adoptive parents who want to learn and practice the most up-to-date newborn care for their babies. Focus is on birth through the first six weeks of life. Topics include feeding, bathing, diapering,  crying, umbilical cord care, circumcision care and safe sleep. Parents learn to recognize symptoms of illness and when to call the pediatrician. Register only the mom-to-be and your partner or support person can plan to come with you! $10 per registrant and support person Childbirth Class- online option: This online class offers you the freedom to complete a Birth and Baby series in the comfort of your own home. The flexibility of this option allows you to review sections at your own pace, at times convenient to you and your support people. It includes additional video information, animations, quizzes, and extended activities. Get organized with helpful eClass tools, checklists, and trackers. Once you register online for the class, you will receive an email within a few days to accept the invitation and begin the  class when the time is right for you. The content will be available to you for 60 days. $60 for 60 days of online access for you and your support people.  Local Doulas: Natural Baby Doulas [email or web address] Tel: [phone] [email or web address] [employer] [phone] [email or web address] The Labor Hassell Halim  (also do waterbirth tub rental) [phone] [email or web address] Triad Birth Doula [phone] [email or web address] NotebookDistributors.fi Sacred Rhythms  [phone] https://[email or web address]-[email or web address]/ [employer] (PADA) [email or web address]://[email or web address]/ [place] Birth and Baby  [email or web address]/ Considering Waterbirth? Guide for patients at [facility] [employer]  Why consider waterbirth?  . Gentle birth for babies . Less pain medicine used in labor . May allow for passive descent/less pushing . May reduce perineal tears  . More mobility and instinctive maternal position changes . Increased maternal relaxation . Reduced blood pressure in labor  Is waterbirth safe? What are the risks of infection, drowning or other complications?  . Infection: o Very low risk (3.7 % for tub vs 4.8% for bed) o 7 in 8000 waterbirths with documented infection o Poorly cleaned equipment most common cause o Slightly lower group B strep transmission rate  . Drowning o Maternal:  - Very low risk   - Related to seizures or fainting o Newborn:  - Very low risk. No evidence of increased risk of respiratory problems in multiple large studies - Physiological protection from breathing under water - Avoid underwater birth if there are any fetal complications - Once baby's head is out of the water, keep it out.  . Birth complication o Some reports of cord trauma, but risk decreased by bringing baby to surface  gradually o No evidence of increased risk of shoulder dystocia. Mothers can usually change positions faster in water than in a bed, possibly aiding the maneuvers to free the shoulder.   You must attend a Doren Custard class at [facility]  3rd Wednesday of every month from 7-9pm  [employer] by calling [phone] or online at [email or web address]  Bring [place] the certificate from the class to your prenatal appointment  Meet with a midwife at 36 weeks to see if you can still plan a waterbirth and to sign the consent.   Purchase or rent the following supplies:   Water Birth Pool (Birth Pool in a Box or [place] for instance)  (Tubs start ~$125)  Single-use disposable tub liner designed for your brand of tub  New garden hose labeled "lead-free", "suitable for drinking water",  Electric drain pump to remove water (We recommend 792 gallon per hour or greater pump.)   Separate garden hose to remove the dirty water  Fish net  Bathing suit top (optional)  Long-handled mirror (optional)  Places to purchase or rent supplies  GotWebTools.is for tub purchases and supplies  Waterbirthsolutions.com for tub purchases and supplies  The Labor Ladies (www.[email or web address]) $275 for tub rental/set-up & take down/kit   [employer] (http://www.fleming.com/.htm) Information regarding doulas (labor support) who provide pool rentals  Our practice has a Birth Pool in a Box tub at the hospital that you may borrow on a first-come-first-served basis. It is your responsibility to to set up, clean and break down the tub. We cannot guarantee the availability of this tub in advance. You are responsible for bringing all accessories listed above. If you do not have all necessary supplies you cannot have a waterbirth.    Things that would prevent you from having a waterbirth:  Premature, <37wks  Previous cesarean birth  Presence of thick meconium-stained  fluid  Multiple gestation (Twins, triplets, etc.)  Uncontrolled diabetes or gestational diabetes requiring medication  Hypertension requiring medication or diagnosis of pre-eclampsia  Heavy vaginal bleeding  Non-reassuring fetal heart rate  Active infection (MRSA, etc.). Group B Strep is NOT a contraindication for  waterbirth.  If your labor has to be induced and induction method requires continuous  monitoring of the baby's heart rate  Other risks/issues identified by your obstetrical provider  Please remember that birth is unpredictable. Under certain unforeseeable circumstances your provider may advise against giving birth in the tub. These decisions will be made on a case-by-case basis and with the safety of you and your baby as our highest priority.     AREA PEDIATRIC/FAMILY [place] [address] [place], [place]  [address] Phone - [phone]   Fax - [phone]  [facility] [place] [address] [place], [place] [address] Phone - [phone]   Fax - [place] [address], [place]  [address] Phone - [phone]   Fax - [phone]  [place] [place] [address] [place], [place]  [address] Phone - [phone]   Fax - [phone]  [place] [address] [place], [place]  [address] Phone - [phone]   Fax - [phone]  [facility] [address] [place], [place]  [address] Phone - [phone]   Fax - [place] [address], [place], [place]  [address] Phone - [phone]   Fax - [phone]  [place] [address] [place], [place] [address] [place], [place]  [address] Phone - [phone]   Fax - [place] [address] [place], [place]  [address] Phone - [phone]    Fax - [phone] [facility] [place]. [address] [place], [place]  [address] Phone - [phone]   Fax - [phone]  [place] [address] [place], [place]  [address] Phone - [phone]   Fax - [phone]  [facility] [place], [place], [place]  [address] Phone - [phone]   Fax - [place] [address] [place], [place]  [address] Phone - [phone]   Fax - [phone]  [facility] [address], [place], [place]  [address] Phone - [place], [place]  [address] Phone - [phone]   Fax - [place] [address], [place], [place]  [address] Phone -  [phone]   Fax - [phone]  [place] [address] [place], [place]  [address] Phone - [phone]   Fax - [place], [place]  [address] Phone - [phone]   Fax - [place], [place], [place]  [address] Phone - [phone]   Fax - [place] [address], [place], [place]  [address] Phone - [phone]   Fax - [phone]  [place] [address], [place], [place]  [address] Phone - [phone]   Fax - [place] [address], [place], [place]  [address] Phone - [phone]   Fax - [phone]  [place] [address] [place], [place]  [address] Phone - [phone]   Fax - [phone] [place] [address] [place], [place]  [address] Phone - [phone]   Fax - [place] [address] [place], [place]  [address] Phone - [phone]   Fax -  [place] [address], [place], [place]  [address] Phone - [phone]   Fax - [phone]  [place] [address] [place] [address] Phone [phone]  Fax [phone]  Safe Medications in Pregnancy   Acne:  Benzoyl Peroxide  Salicylic Acid   Backache/Headache:  Tylenol: 2 regular strength every 4 hours OR        2 Extra strength every 6 hours   Colds/Coughs/Allergies:  Benadryl (alcohol free) 25 mg every 6 hours as needed  Breath right strips  Claritin  Cepacol throat lozenges  Chloraseptic throat spray  Cold-Eeze- up to three times per day  Cough drops, alcohol free  Flonase (by prescription only)  Guaifenesin  Mucinex  Robitussin DM (plain only, alcohol free)  Saline nasal spray/drops  Sudafed (pseudoephedrine) & Actifed * use only after [redacted] weeks gestation and if you do not have high blood pressure  Tylenol  Vicks Vaporub  Zinc lozenges  Zyrtec   Constipation:  Colace  Ducolax suppositories  Fleet enema  Glycerin suppositories  Metamucil  Milk of magnesia  Miralax  Senokot  Smooth move tea   Diarrhea:  Kaopectate  Imodium A-D   *NO pepto Bismol   Hemorrhoids:  Anusol  Anusol HC  Preparation H  Tucks   Indigestion:  Tums  Maalox  Mylanta  Zantac  Pepcid   Insomnia:  Benadryl (alcohol free) 5m every 6 hours as needed  Tylenol PM  Unisom, no Gelcaps   Leg Cramps:  Tums  MagGel   Nausea/Vomiting:  Bonine  Dramamine  Emetrol  Ginger extract  Sea bands  Meclizine  Nausea medication to take during pregnancy:  Unisom (doxylamine succinate 25 mg tablets) Take one tablet daily at bedtime. If symptoms are not adequately controlled, the dose can be increased to a maximum recommended dose of two tablets daily (1/2 tablet in the morning, 1/2 tablet mid-afternoon and one at bedtime).  Vitamin B6 1085mtablets.  Take one tablet twice a day (up to 200 mg per day).   Skin Rashes:  Aveeno products  Benadryl cream or 2543mvery 6 hours as needed  Calamine Lotion  1% cortisone cream   Yeast infection:  Gyne-lotrimin 7  Monistat 7    **If taking multiple medications, please check labels to avoid  duplicating the same active ingredients  **take medication as directed on the label  ** Do not exceed 4000 mg of tylenol in 24 hours  **Do not take medications that contain aspirin or ibuprofen

## 2017-06-03 LAB — HEMOGLOBINOPATHY EVALUATION
Ferritin: 124 ng/mL (ref 15–150)
HGB A2 QUANT: 2.2 % (ref 1.8–3.2)
HGB A: 97.8 % (ref 96.4–98.8)
HGB C: 0 %
HGB S: 0 %
HGB VARIANT: 0 %
Hgb F Quant: 0 % (ref 0.0–2.0)
Hgb Solubility: NEGATIVE

## 2017-06-03 LAB — OBSTETRIC PANEL, INCLUDING HIV
Antibody Screen: NEGATIVE
Basophils Absolute: 0 10*3/uL (ref 0.0–0.2)
Basos: 0 %
EOS (ABSOLUTE): 0.2 10*3/uL (ref 0.0–0.4)
EOS: 2 %
HEMOGLOBIN: 11.9 g/dL (ref 11.1–15.9)
HEP B S AG: NEGATIVE
HIV SCREEN 4TH GENERATION: NONREACTIVE
Hematocrit: 36 % (ref 34.0–46.6)
Immature Grans (Abs): 0 10*3/uL (ref 0.0–0.1)
Immature Granulocytes: 0 %
LYMPHS ABS: 2.3 10*3/uL (ref 0.7–3.1)
Lymphs: 27 %
MCH: 29.4 pg (ref 26.6–33.0)
MCHC: 33.1 g/dL (ref 31.5–35.7)
MCV: 89 fL (ref 79–97)
MONOS ABS: 0.5 10*3/uL (ref 0.1–0.9)
Monocytes: 6 %
NEUTROS ABS: 5.6 10*3/uL (ref 1.4–7.0)
Neutrophils: 65 %
PLATELETS: 241 10*3/uL (ref 150–450)
RBC: 4.05 x10E6/uL (ref 3.77–5.28)
RDW: 14.1 % (ref 12.3–15.4)
RH TYPE: POSITIVE
RPR: NONREACTIVE
Rubella Antibodies, IGG: 12 index (ref >0.99)
WBC: 8.6 10*3/uL (ref 3.4–10.8)

## 2017-06-03 LAB — HEMOGLOBIN A1C
Est. average glucose Bld gHb Est-mCnc: 117 mg/dL
Hgb A1c MFr Bld: 5.7 % — ABNORMAL HIGH (ref 4.8–5.6)

## 2017-06-16 ENCOUNTER — Encounter: Payer: Self-pay | Admitting: Family Medicine

## 2017-06-25 ENCOUNTER — Encounter (HOSPITAL_COMMUNITY): Payer: Self-pay

## 2017-06-26 ENCOUNTER — Encounter (HOSPITAL_COMMUNITY): Payer: Self-pay

## 2017-06-26 ENCOUNTER — Inpatient Hospital Stay (HOSPITAL_COMMUNITY)
Admission: AD | Admit: 2017-06-26 | Discharge: 2017-06-26 | Disposition: A | Discharge status: Home / Self Care | Payer: Medicaid Other | Source: Ambulatory Visit | Attending: Obstetrics & Gynecology | Admitting: Obstetrics & Gynecology

## 2017-06-26 DIAGNOSIS — O0930 Supervision of pregnancy with insufficient antenatal care, unspecified trimester: Secondary | ICD-10-CM

## 2017-06-26 DIAGNOSIS — O98512 Other viral diseases complicating pregnancy, second trimester: Secondary | ICD-10-CM | POA: Diagnosis not present

## 2017-06-26 DIAGNOSIS — O99280 Endocrine, nutritional and metabolic diseases complicating pregnancy, unspecified trimester: Secondary | ICD-10-CM

## 2017-06-26 DIAGNOSIS — O26892 Other specified pregnancy related conditions, second trimester: Secondary | ICD-10-CM | POA: Diagnosis not present

## 2017-06-26 DIAGNOSIS — O99210 Obesity complicating pregnancy, unspecified trimester: Secondary | ICD-10-CM

## 2017-06-26 DIAGNOSIS — N760 Acute vaginitis: Secondary | ICD-10-CM | POA: Diagnosis not present

## 2017-06-26 DIAGNOSIS — R55 Syncope and collapse: Secondary | ICD-10-CM | POA: Insufficient documentation

## 2017-06-26 DIAGNOSIS — Z87891 Personal history of nicotine dependence: Secondary | ICD-10-CM | POA: Diagnosis not present

## 2017-06-26 DIAGNOSIS — E860 Dehydration: Secondary | ICD-10-CM | POA: Insufficient documentation

## 2017-06-26 DIAGNOSIS — B9689 Other specified bacterial agents as the cause of diseases classified elsewhere: Secondary | ICD-10-CM | POA: Insufficient documentation

## 2017-06-26 DIAGNOSIS — O26899 Other specified pregnancy related conditions, unspecified trimester: Secondary | ICD-10-CM

## 2017-06-26 DIAGNOSIS — O23592 Infection of other part of genital tract in pregnancy, second trimester: Secondary | ICD-10-CM | POA: Diagnosis not present

## 2017-06-26 DIAGNOSIS — Z3A17 17 weeks gestation of pregnancy: Secondary | ICD-10-CM | POA: Insufficient documentation

## 2017-06-26 DIAGNOSIS — Z3480 Encounter for supervision of other normal pregnancy, unspecified trimester: Secondary | ICD-10-CM

## 2017-06-26 DIAGNOSIS — B009 Herpesviral infection, unspecified: Secondary | ICD-10-CM | POA: Diagnosis not present

## 2017-06-26 DIAGNOSIS — O99282 Endocrine, nutritional and metabolic diseases complicating pregnancy, second trimester: Secondary | ICD-10-CM | POA: Insufficient documentation

## 2017-06-26 LAB — URINALYSIS, ROUTINE W REFLEX MICROSCOPIC
BILIRUBIN URINE: NEGATIVE
Glucose, UA: NEGATIVE mg/dL
Hgb urine dipstick: NEGATIVE
KETONES UR: 20 mg/dL — AB
Leukocytes, UA: NEGATIVE
Nitrite: NEGATIVE
Protein, ur: 30 mg/dL — AB
Specific Gravity, Urine: 1.021 (ref 1.005–1.030)
pH: 6 (ref 5.0–8.0)

## 2017-06-26 LAB — COMPREHENSIVE METABOLIC PANEL
ALBUMIN: 3.2 g/dL — AB (ref 3.5–5.0)
ALK PHOS: 56 U/L (ref 38–126)
ALT: 15 U/L (ref 14–54)
ANION GAP: 9 (ref 5–15)
AST: 16 U/L (ref 15–41)
BUN: 9 mg/dL (ref 6–20)
CO2: 21 mmol/L — AB (ref 22–32)
Calcium: 8.6 mg/dL — ABNORMAL LOW (ref 8.9–10.3)
Chloride: 103 mmol/L (ref 101–111)
Creatinine, Ser: 0.76 mg/dL (ref 0.44–1.00)
GFR calc Af Amer: >60 mL/min (ref >60)
GFR calc non Af Amer: >60 mL/min (ref >60)
GLUCOSE: 102 mg/dL — AB (ref 65–99)
POTASSIUM: 3.9 mmol/L (ref 3.5–5.1)
SODIUM: 133 mmol/L — AB (ref 135–145)
Total Bilirubin: 0.4 mg/dL (ref 0.3–1.2)
Total Protein: 7 g/dL (ref 6.5–8.1)

## 2017-06-26 LAB — CBC
HCT: 32.6 % — ABNORMAL LOW (ref 36.0–46.0)
HEMOGLOBIN: 11.4 g/dL — AB (ref 12.0–15.0)
MCH: 30.2 pg (ref 26.0–34.0)
MCHC: 35 g/dL (ref 30.0–36.0)
MCV: 86.2 fL (ref 78.0–100.0)
Platelets: 224 10*3/uL (ref 150–400)
RBC: 3.78 MIL/uL — ABNORMAL LOW (ref 3.87–5.11)
RDW: 13.2 % (ref 11.5–15.5)
WBC: 10.1 10*3/uL (ref 4.0–10.5)

## 2017-06-26 MED ORDER — DEXTROSE IN LACTATED RINGERS 5 % IV SOLN
INTRAVENOUS | Status: DC
  Administered 2017-06-26: 15:00:00 via INTRAVENOUS

## 2017-06-26 MED ORDER — LACTATED RINGERS IV SOLN
INTRAVENOUS | Status: DC
  Administered 2017-06-26: 16:00:00 via INTRAVENOUS

## 2017-06-26 NOTE — MAU Note (Signed)
[facility] was at [employer] and started feeling dizzy and hot. Passed out and lost consciousness for about a minute. EMS said she did have some orthostatic BP, dropped to 70 systolic. Blood sugar was 115.  Has been taking Zofran for n/v and is constipated x3 days.

## 2017-06-26 NOTE — Discharge Instructions (Signed)
Eating Plan for Pregnant Women While you are pregnant, your body will require additional nutrition to help support your growing baby. It is recommended that you consume:  150 additional calories each day during your first trimester.  300 additional calories each day during your second trimester.  300 additional calories each day during your third trimester.  Eating a healthy, well-balanced diet is very important for your health and for your baby's health. You also have a higher need for some vitamins and minerals, such as folic acid, calcium, iron, and vitamin D. What do I need to know about eating during pregnancy?  Do not try to lose weight or go on a diet during pregnancy.  Choose healthy, nutritious foods. Choose  of a sandwich with a glass of milk instead of a candy bar or a high-calorie sugar-sweetened beverage.  Limit your overall intake of foods that have "empty calories." These are foods that have little nutritional value, such as sweets, desserts, candies, sugar-sweetened beverages, and fried foods.  Eat a variety of foods, especially fruits and vegetables.  Take a prenatal vitamin to help meet the additional needs during pregnancy, specifically for folic acid, iron, calcium, and vitamin D.  Remember to stay active. Ask your health care provider for exercise recommendations that are specific to you.  Practice good food safety and cleanliness, such as washing your hands before you eat and after you prepare raw meat. This helps to prevent foodborne illnesses, such as listeriosis, that can be very dangerous for your baby. Ask your health care provider for more information about listeriosis. What does 150 extra calories look like? Healthy options for an additional 150 calories each day could be any of the following:  Plain low-fat yogurt (6-8 oz) with  cup of berries.  1 apple with 2 teaspoons of peanut butter.  Cut-up vegetables with  cup of hummus.  Low-fat chocolate  milk (8 oz or 1 cup).  1 string cheese with 1 medium orange.   of a peanut butter and jelly sandwich on whole-wheat bread (1 tsp of peanut butter).  For 300 calories, you could eat two of those healthy options each day. What is a healthy amount of weight to gain? The recommended amount of weight for you to gain is based on your pre-pregnancy BMI. If your pre-pregnancy BMI was:  Less than 18 (underweight), you should gain 28-40 lb.  18-24.9 (normal), you should gain 25-35 lb.  25-29.9 (overweight), you should gain 15-25 lb.  Greater than 30 (obese), you should gain 11-20 lb.  What if I am having twins or multiples? Generally, pregnant women who will be having twins or multiples may need to increase their daily calories by 300-600 calories each day. The recommended range for total weight gain is 25-54 lb, depending on your pre-pregnancy BMI. Talk with your health care provider for specific guidance about additional nutritional needs, weight gain, and exercise during your pregnancy. What foods can I eat? Grains Any grains. Try to choose whole grains, such as whole-wheat bread, oatmeal, or brown rice. Vegetables Any vegetables. Try to eat a variety of colors and types of vegetables to get a full range of vitamins and minerals. Remember to wash your vegetables well before eating. Fruits Any fruits. Try to eat a variety of colors and types of fruit to get a full range of vitamins and minerals. Remember to wash your fruits well before eating. Meats and Other Protein Sources Lean meats, including chicken, turkey, fish, and lean cuts of beef, veal,   or pork. Make sure that all meats are cooked to "well done." Tofu. Tempeh. Beans. Eggs. Peanut butter and other nut butters. Seafood, such as shrimp, crab, and lobster. If you choose fish, select types that are higher in omega-3 fatty acids, including salmon, herring, mussels, trout, sardines, and pollock. Make sure that all meats are cooked to  food-safe temperatures. Dairy Pasteurized milk and milk alternatives. Pasteurized yogurt and pasteurized cheese. Cottage cheese. Sour cream. Beverages Water. Juices that contain 100% fruit juice or vegetable juice. Caffeine-free teas and decaffeinated coffee. Drinks that contain caffeine are okay to drink, but it is better to avoid caffeine. Keep your total caffeine intake to less than 200 mg each day (12 oz of coffee, tea, or soda) or as directed by your health care provider. Condiments Any pasteurized condiments. Sweets and Desserts Any sweets and desserts. Fats and Oils Any fats and oils. The items listed above may not be a complete list of recommended foods or beverages. Contact your dietitian for more options. What foods are not recommended? Vegetables Unpasteurized (raw) vegetable juices. Fruits Unpasteurized (raw) fruit juices. Meats and Other Protein Sources Cured meats that have nitrates, such as bacon, salami, and hotdogs. Luncheon meats, bologna, or other deli meats (unless they are reheated until they are steaming hot). Refrigerated pate, meat spreads from a meat counter, smoked seafood that is found in the refrigerated section of a store. Raw fish, such as sushi or sashimi. High mercury content fish, such as tilefish, shark, swordfish, and king mackerel. Raw meats, such as tuna or beef tartare. Undercooked meats and poultry. Make sure that all meats are cooked to food-safe temperatures. Dairy Unpasteurized (raw) milk and any foods that have raw milk in them. Soft cheeses, such as feta, queso blanco, queso fresco, Brie, Camembert cheeses, blue-veined cheeses, and Panela cheese (unless it is made with pasteurized milk, which must be stated on the label). Beverages Alcohol. Sugar-sweetened beverages, such as sodas, teas, or energy drinks. Condiments Homemade fermented foods and drinks, such as pickles, sauerkraut, or kombucha drinks. (Store-bought pasteurized versions of these are  okay.) Other Salads that are made in the store, such as ham salad, chicken salad, egg salad, tuna salad, and seafood salad. The items listed above may not be a complete list of foods and beverages to avoid. Contact your dietitian for more information. This information is not intended to replace advice given to you by your health care provider. Make sure you discuss any questions you have with your health care provider. Document Released: 10/06/2013 Document Revised: 05/30/2015 Document Reviewed: 06/06/2013 Elsevier Interactive Patient Education  2018 Elsevier Inc.   

## 2017-06-26 NOTE — MAU Provider Note (Addendum)
History     CSN: [id number]  Arrival date and time: 06/26/17 1306   First Provider Initiated Contact with Patient 06/26/17 1317      Chief Complaint  Patient presents with  . Loss of Consciousness   HPI  Joy [name] is a 27 y.o. G2P1001 at [redacted]w[redacted]d who presents to MAU via EMS after fainting at [employer] late earlier today at approximately 12:45pm. Reports she was shopping alone when she started to feel lightheaded. She immediately sat down then woke up flat on her back surrounded by patrons and EMS. Unable to say if she sustained direct trauma to her abdomen. Denies vaginal bleeding, leaking of fluid, headache, decreased fetal movement, fever, or recent illness.    Reports she has not had a bowel movement in approximately three days. States she vomited this morning and has not had anything to eat or drink since yesterday. Normally eats 1 or 2 meals per day, drinks 1 cup of water "when I can".  OB History    Gravida  2   Para  1   Term  1   Preterm      AB      Living  1     SAB      TAB      Ectopic      Multiple  0   Live Births  1           Past Medical History:  Diagnosis Date  . BV (bacterial vaginosis)   . Genital herpes     Past Surgical History:  Procedure Laterality Date  . WISDOM TOOTH EXTRACTION      History reviewed. No pertinent family history.  Social History   Tobacco Use  . Smoking status: Former Games developer  . Smokeless tobacco: Never Used  Substance Use Topics  . Alcohol use: Not Currently    Comment: quit with positive HPT (03/22/2017)  . Drug use: Yes    Types: Marijuana    Comment: pt states she quit with this positive HPT    Allergies: No Known Allergies  Medications Prior to Admission  Medication Sig Dispense Refill Last Dose  . loratadine (CLARITIN) 10 MG tablet Take 1 tablet (10 mg total) by mouth daily. (Patient not taking: Reported on 05/19/2017) 15 tablet 0 Not Taking at Unknown time  . ondansetron (ZOFRAN-ODT) 8 MG  disintegrating tablet Take 8 mg by mouth every 8 (eight) hours as needed for nausea or vomiting.   Taking  . Prenatal Vit-Fe Fumarate-FA (PRENATAL MULTIVITAMIN) TABS tablet Take 1 tablet by mouth daily at 12 noon.    Taking  . promethazine (PHENERGAN) 25 MG suppository Place 1 suppository (25 mg total) rectally every 6 (six) hours as needed for nausea or vomiting. (Patient not taking: Reported on 05/19/2017) 12 each 3 Not Taking at Unknown time  . ranitidine (ZANTAC) 150 MG tablet Take 1 tablet (150 mg total) by mouth 2 (two) times daily. (Patient not taking: Reported on 05/19/2017) 60 tablet 0 Not Taking at Unknown time    Review of Systems  Respiratory: Negative for chest tightness and shortness of breath.   Cardiovascular: Negative for palpitations.  Gastrointestinal: Positive for constipation, nausea and vomiting. Negative for diarrhea.  Endocrine: Negative for polydipsia, polyphagia and polyuria.   Physical Exam   Temperature 98.2 F (36.8 C), temperature source Oral, resp. rate 16, last menstrual period 02/09/2017, SpO2 100 %, unknown if currently breastfeeding.  Physical Exam  Nursing note and vitals reviewed. Constitutional: She is oriented  to person, place, and time. She appears well-developed and well-nourished.  Cardiovascular: Normal rate and regular rhythm.  Respiratory: Breath sounds normal. She is in respiratory distress.  GI: There is no tenderness. There is no CVA tenderness.  Gravid  Musculoskeletal: Normal range of motion.  Neurological: She is alert and oriented to person, place, and time. She has normal reflexes.  Skin: Skin is warm and dry.  Psychiatric: She has a normal mood and affect. Her behavior is normal. Judgment and thought content normal.    MAU Course  Procedures  MDM Orders Placed This Encounter  Procedures  . CBC    Standing Status:   Standing    Number of Occurrences:   1  . Comprehensive metabolic panel    Standing Status:   Standing     Number of Occurrences:   1  . Urinalysis, Routine w reflex microscopic    Standing Status:   Standing    Number of Occurrences:   1  . Insert peripheral IV    Standing Status:   Standing    Number of Occurrences:   1  . Discharge patient    Order Specific Question:   Discharge disposition    Answer:   01-Home or Self Care [1]    Order Specific Question:   Discharge patient date    Answer:   06/26/2017   Results for orders placed or performed during the hospital encounter of 06/26/17 (from the past 24 hour(s))  CBC     Status: Abnormal   Collection Time: 06/26/17  1:50 PM  Result Value Ref Range   WBC 10.1 4.0 - 10.5 K/uL   RBC 3.78 (L) 3.87 - 5.11 MIL/uL   Hemoglobin 11.4 (L) 12.0 - 15.0 g/dL   HCT 82.932.6 (L) 56.236.0 - 13.046.0 %   MCV 86.2 78.0 - 100.0 fL   MCH 30.2 26.0 - 34.0 pg   MCHC 35.0 30.0 - 36.0 g/dL   RDW 86.513.2 78.411.5 - 69.615.5 %   Platelets 224 150 - 400 K/uL  Comprehensive metabolic panel     Status: Abnormal   Collection Time: 06/26/17  1:50 PM  Result Value Ref Range   Sodium 133 (L) 135 - 145 mmol/L   Potassium 3.9 3.5 - 5.1 mmol/L   Chloride 103 101 - 111 mmol/L   CO2 21 (L) 22 - 32 mmol/L   Glucose, Bld 102 (H) 65 - 99 mg/dL   BUN 9 6 - 20 mg/dL   Creatinine, Ser 2.950.76 0.44 - 1.00 mg/dL   Calcium 8.6 (L) 8.9 - 10.3 mg/dL   Total Protein 7.0 6.5 - 8.1 g/dL   Albumin 3.2 (L) 3.5 - 5.0 g/dL   AST 16 15 - 41 U/L   ALT 15 14 - 54 U/L   Alkaline Phosphatase 56 38 - 126 U/L   Total Bilirubin 0.4 0.3 - 1.2 mg/dL   GFR calc non Af Amer >60 >60 mL/min   GFR calc Af Amer >60 >60 mL/min   Anion gap 9 5 - 15  Urinalysis, Routine w reflex microscopic     Status: Abnormal   Collection Time: 06/26/17  2:08 PM  Result Value Ref Range   Color, Urine YELLOW YELLOW   APPearance HAZY (A) CLEAR   Specific Gravity, Urine 1.021 1.005 - 1.030   pH 6.0 5.0 - 8.0   Glucose, UA NEGATIVE NEGATIVE mg/dL   Hgb urine dipstick NEGATIVE NEGATIVE   Bilirubin Urine NEGATIVE NEGATIVE   Ketones,  ur 20 (  A) NEGATIVE mg/dL   Protein, ur 30 (A) NEGATIVE mg/dL   Nitrite NEGATIVE NEGATIVE   Leukocytes, UA NEGATIVE NEGATIVE   RBC / HPF 0-5 0 - 5 RBC/hpf   WBC, UA 0-5 0 - 5 WBC/hpf   Bacteria, UA RARE (A) NONE SEEN   Squamous Epithelial / LPF 0-5 0 - 5   Mucus PRESENT    Normal EKG collected by EMS personnel Hemodynamically stable in MAU FHT 156 by Doppler Unknown level of abdominal trauma, s/p 4 hours of observation, no new symptoms Feeling better s/p 2L fluid  Assessment and Plan  --Female 27 y.o. G2P1001 at [redacted]w[redacted]d  --Dehydration in pregnancy --Reviewed eating plan and PO hydration of 64 fl oz minimum --Next OB appointment 06/29/17 at [facility] --Discharge home in stable condition  Calvert Cantor, CNM 06/26/2017, 4:54 PM

## 2017-06-29 ENCOUNTER — Other Ambulatory Visit: Payer: Self-pay

## 2017-06-29 ENCOUNTER — Ambulatory Visit (INDEPENDENT_AMBULATORY_CARE_PROVIDER_SITE_OTHER): Payer: Medicaid Other | Admitting: Nurse Practitioner

## 2017-06-29 VITALS — BP 102/59 | HR 76 | Wt 255.0 lb

## 2017-06-29 DIAGNOSIS — Z3480 Encounter for supervision of other normal pregnancy, unspecified trimester: Secondary | ICD-10-CM

## 2017-06-29 DIAGNOSIS — O99212 Obesity complicating pregnancy, second trimester: Secondary | ICD-10-CM

## 2017-06-29 DIAGNOSIS — Z3482 Encounter for supervision of other normal pregnancy, second trimester: Secondary | ICD-10-CM

## 2017-06-29 DIAGNOSIS — O219 Vomiting of pregnancy, unspecified: Secondary | ICD-10-CM | POA: Insufficient documentation

## 2017-06-29 DIAGNOSIS — O99210 Obesity complicating pregnancy, unspecified trimester: Secondary | ICD-10-CM

## 2017-06-29 DIAGNOSIS — E669 Obesity, unspecified: Secondary | ICD-10-CM

## 2017-06-29 LAB — POCT URINALYSIS DIP (DEVICE)
Bilirubin Urine: NEGATIVE
Glucose, UA: NEGATIVE mg/dL
HGB URINE DIPSTICK: NEGATIVE
KETONES UR: NEGATIVE mg/dL
Nitrite: NEGATIVE
PH: 6.5 (ref 5.0–8.0)
PROTEIN: 30 mg/dL — AB
Specific Gravity, Urine: 1.025 (ref 1.005–1.030)
Urobilinogen, UA: 1 mg/dL (ref 0.0–1.0)

## 2017-06-29 NOTE — Progress Notes (Signed)
    Subjective:  Joy [name] is a 27 y.o. G2P1001 at 58104w0d being seen today for ongoing prenatal care.  She is currently monitored for the following issues for this low-risk pregnancy and has Genital herpes; Supervision of other normal pregnancy, antepartum; Obesity in pregnancy, antepartum; Obesity, Class III, BMI 40-49.9 (morbid obesity) (HCC); Late prenatal care; and Nausea and vomiting in pregnancy prior to [redacted] weeks gestation on their problem list.  Patient reports episode of fainting over the weekend and was seen in MAU.  Contractions: Not present. Vag. Bleeding: None.  Movement: Present. Denies leaking of fluid.   Reviewed note from MAU - still feels weak at times.  Did notice feeling like she should lie down prior to fainting.  The following portions of the patient's history were reviewed and updated as appropriate: allergies, current medications, past family history, past medical history, past social history, past surgical history and problem list. Problem list updated.  Objective:   Vitals:   06/29/17 0936  BP: (!) 102/59  Pulse: 76  Weight: 255 lb (115.7 kg)    Fetal Status: Fetal Heart Rate (bpm): 150 Fundal Height: 18 cm Movement: Present     General:  Alert, oriented and cooperative. Patient is in no acute distress.  Skin: Skin is warm and dry. No rash noted.   Cardiovascular: Normal heart rate noted  Respiratory: Normal respiratory effort, no problems with respiration noted  Abdomen: Soft, gravid, appropriate for gestational age. Pain/Pressure: Present     Pelvic:  Cervical exam deferred        Extremities: Normal range of motion.  Edema: None  Mental Status: Normal mood and affect. Normal behavior. Normal judgment and thought content.   Urinalysis:      Assessment and Plan:  Pregnancy: G2P1001 at 5104w0d  1. Supervision of other normal pregnancy, antepartum Reviewed syncopal episode with client - noted symptoms she had prior to fainting.  Reviewed dietary choices and  promoted protein at every meal and no skipping of meals.  Will monitor symptoms.  Has US appointment in July.  2. Obesity in pregnancy, antepartum Healthy food choices encouraged.  3. Nausea and vomiting in pregnancy prior to [redacted] weeks gestation Has zofran she is using and is using Miralax occasionally for constipation.  Advised to use Miralax on a daily basis until her stools are daily and soft and then use Miralax every other day.  Preterm labor symptoms and general obstetric precautions including but not limited to vaginal bleeding, contractions, leaking of fluid and fetal movement were reviewed in detail with the patient. Please refer to After Visit Summary for other counseling recommendations.  Return in about 1 month (around 07/27/2017).  Nolene BernheimERRI Malyn Aytes, RN, MSN, NP-BC Nurse Practitioner, [facility], [facility] 06/29/2017 10:08 AM

## 2017-06-29 NOTE — Patient Instructions (Addendum)
Eat small amounts of healthy food including protein every 3 hours. Drink at least 8 8-oz glasses of water every day.   BENEFITS OF BREASTFEEDING Many women wonder if they should breastfeed. Research shows that breast milk contains the perfect balance of vitamins, protein and fat that your baby needs to grow. It also contains antibodies that help your baby's immune system to fight off viruses and bacteria and can reduce the risk of sudden infant death syndrome (SIDS). In addition, the colostrum (a fluid secreted from the breast in the first few days after delivery) helps your newborn's digestive system to grow and function well. Breast milk is easier to digest than formula. Also, if your baby is born preterm, breast milk can help to reduce both short- and long-term health problems. BENEFITS OF BREASTFEEDING FOR MOM . Breastfeeding causes a hormone to be released that helps the uterus to contract and return to its normal size more quickly. . It aids in postpartum weight loss, reduces risk of breast and ovarian cancer, heart disease and rheumatoid arthritis. . It decreases the amount of bleeding after the baby is born. benefits of breastfeeding for baby . Provides comfort and nutrition . Protects baby against - Obesity - Diabetes - Asthma - Childhood cancers - Heart disease - Ear infections - Diarrhea - Pneumonia - Stomach problems - Serious allergies - Skin rashes . Promotes growth and development . Reduces the risk of baby having Sudden Infant Death Syndrome (SIDS) only breastmilk for the first 6 months . Protects baby against diseases/allergies . It's the perfect amount for tiny bellies . It restores baby's energy . Provides the best nutrition for baby . Giving water or formula can make baby more likely to get sick, decrease Mom's milk supply, make baby less content with breastfeeding Skin to Skin After delivery, the staff will place your baby on your chest. This helps with the  following: . Regulates baby's temperature, breathing, heart rate and blood sugar . Increases Mom's milk supply . Promotes bonding . Keeps baby and Mom calm and decreases baby's crying Rooming In Your baby will stay in your room with you for the entire time you are in the hospital. This helps with the following: . Allows Mom to learn baby's feeding cues - Fluttering eyes - Sucking on tongue or hand - Rooting (opens mouth and turns head) - Nuzzling into the breast - Bringing hand to mouth . Allows breastfeeding on demand (when your baby is ready) . Helps baby to be calm and content . Ensures a good milk supply . Prevents complications with breastfeeding . Allows parents to learn to care for baby . Allows you to request assistance with breastfeeding Importance of a good latch . Increases milk transfer to baby - baby gets enough milk . Ensures you have enough milk for your baby . Decreases nipple soreness . Don't use pacifiers and bottles - these cause baby to suck differently than breastfeeding . Promotes continuation of breastfeeding Risks of Formula Supplementation with Breastfeeding Giving your infant formula in addition to your breast-milk EXCEPT when medically necessary can lead to: Marland Kitchen. Decreases your milk supply  . Loss of confidence in yourself for providing baby's nutrition  . Engorgement and possibly mastitis  . Asthma & allergies in the baby BREASTFEEDING FAQS How long should I breastfeed my baby? It is recommended that you provide your baby with breast milk only for the first 6 months and then continue for the first year and longer as desired. During the first  few weeks after birth, your baby will need to feed 8-12 times every 24 hours, or every 2-3 hours. They will likely feed for 15-30 minutes. How can I help my baby begin breastfeeding? Babies are born with an instinct to breastfeed. A healthy baby can begin breastfeeding right away without specific help. At the hospital,  a nurse (or lactation consultant) will help you begin the process and will give you tips on good positioning. It may be helpful to take a breastfeeding class before you deliver in order to know what to expect. How can I help my baby latch on? In order to assist your baby in latching-on, cup your breast in your hand and stroke your baby's lower lip with your nipple to stimulate your baby's rooting reflex. Your baby will look like he or she is yawning, at which point you should bring the baby towards your breast, while aiming the nipple at the roof of his or her mouth. Remember to bring the baby towards you and not your breast towards the baby. How can I tell if my baby is latched-on? Your baby will have all of your nipple and part of the dark area around the nipple in his or her mouth and your baby's nose will be touching your breast. You should see or hear the baby swallowing. If the baby is not latched-on properly, start the process over. To remove the suction, insert a clean finger between your breast and the baby's mouth. Should I switch breasts during feeding? After feeding on one side, switch the baby to your other breast. If he or she does not continue feeding - that is OK. Your baby will not necessarily need to feed from both breasts in a single feeding. On the next feeding, start with the other breast for efficiency and comfort. How can I tell if my baby is hungry? When your baby is hungry, they will nuzzle against your breast, make sucking noises and tongue motions and may put their hands near their mouth. Crying is a late sign of hunger, so you should not wait until this point. When they have received enough milk, they will unlatch from the breast. Is it okay to use a pacifier? Until your baby gets the hang of breastfeeding, experts recommend limiting pacifier usage. If you have questions about this, please contact your pediatrician. What can I do to ensure proper nutrition while  breastfeeding? . Make sure that you support your own health and your baby's by eating a healthy, well-balanced diet . Your provider may recommend that you continue to take your prenatal vitamin . Drink plenty of fluids. It is a good rule to drink one glass of water before or after feeding . Alcohol will remain in the breast milk for as long as it will remain in the blood stream. If you choose to have a drink, it is recommended that you wait at least 2 hours before feeding . Moderate amounts of caffeine are OK . Some over-the-counter or prescription medications are not recommended during breastfeeding. Check with your provider if you have questions What types of birth control methods are safe while breastfeeding? Progestin-only methods, including a daily pill, an IUD, the implant and the injection are safe while breastfeeding. Methods that contain estrogen (such as combination birth control pills, the vaginal ring and the patch) should not be used during the first month of breastfeeding as these can decrease your milk supply.

## 2017-07-06 ENCOUNTER — Encounter (HOSPITAL_COMMUNITY): Payer: Self-pay

## 2017-07-06 ENCOUNTER — Ambulatory Visit (HOSPITAL_COMMUNITY)
Admission: RE | Admit: 2017-07-06 | Discharge: 2017-07-06 | Disposition: A | Discharge status: Home / Self Care | Payer: Medicaid Other | Source: Ambulatory Visit | Attending: Medical | Admitting: Medical

## 2017-07-06 ENCOUNTER — Other Ambulatory Visit: Payer: Self-pay | Admitting: Medical

## 2017-07-06 DIAGNOSIS — Z3A19 19 weeks gestation of pregnancy: Secondary | ICD-10-CM | POA: Insufficient documentation

## 2017-07-06 DIAGNOSIS — Z3480 Encounter for supervision of other normal pregnancy, unspecified trimester: Secondary | ICD-10-CM

## 2017-07-06 DIAGNOSIS — O99212 Obesity complicating pregnancy, second trimester: Secondary | ICD-10-CM

## 2017-07-06 DIAGNOSIS — Z363 Encounter for antenatal screening for malformations: Secondary | ICD-10-CM

## 2017-07-06 DIAGNOSIS — O99210 Obesity complicating pregnancy, unspecified trimester: Secondary | ICD-10-CM

## 2017-07-23 ENCOUNTER — Inpatient Hospital Stay (HOSPITAL_COMMUNITY)
Admission: AD | Admit: 2017-07-23 | Discharge: 2017-07-23 | Disposition: A | Discharge status: Home / Self Care | Payer: Medicaid Other | Source: Ambulatory Visit | Attending: Obstetrics and Gynecology | Admitting: Obstetrics and Gynecology

## 2017-07-23 ENCOUNTER — Encounter (HOSPITAL_COMMUNITY): Payer: Self-pay | Admitting: *Deleted

## 2017-07-23 DIAGNOSIS — Z87891 Personal history of nicotine dependence: Secondary | ICD-10-CM | POA: Insufficient documentation

## 2017-07-23 DIAGNOSIS — M545 Low back pain: Secondary | ICD-10-CM | POA: Diagnosis not present

## 2017-07-23 DIAGNOSIS — O23592 Infection of other part of genital tract in pregnancy, second trimester: Secondary | ICD-10-CM | POA: Insufficient documentation

## 2017-07-23 DIAGNOSIS — Y92410 Unspecified street and highway as the place of occurrence of the external cause: Secondary | ICD-10-CM | POA: Insufficient documentation

## 2017-07-23 DIAGNOSIS — B9689 Other specified bacterial agents as the cause of diseases classified elsewhere: Secondary | ICD-10-CM | POA: Insufficient documentation

## 2017-07-23 DIAGNOSIS — T1490XA Injury, unspecified, initial encounter: Secondary | ICD-10-CM | POA: Diagnosis present

## 2017-07-23 DIAGNOSIS — O26892 Other specified pregnancy related conditions, second trimester: Secondary | ICD-10-CM | POA: Insufficient documentation

## 2017-07-23 DIAGNOSIS — O9A212 Injury, poisoning and certain other consequences of external causes complicating pregnancy, second trimester: Secondary | ICD-10-CM | POA: Insufficient documentation

## 2017-07-23 DIAGNOSIS — M542 Cervicalgia: Secondary | ICD-10-CM | POA: Diagnosis not present

## 2017-07-23 DIAGNOSIS — B009 Herpesviral infection, unspecified: Secondary | ICD-10-CM | POA: Diagnosis not present

## 2017-07-23 DIAGNOSIS — O0930 Supervision of pregnancy with insufficient antenatal care, unspecified trimester: Secondary | ICD-10-CM

## 2017-07-23 DIAGNOSIS — S199XXA Unspecified injury of neck, initial encounter: Secondary | ICD-10-CM

## 2017-07-23 DIAGNOSIS — Z3A21 21 weeks gestation of pregnancy: Secondary | ICD-10-CM | POA: Diagnosis present

## 2017-07-23 DIAGNOSIS — S3992XA Unspecified injury of lower back, initial encounter: Secondary | ICD-10-CM | POA: Diagnosis not present

## 2017-07-23 DIAGNOSIS — Z3480 Encounter for supervision of other normal pregnancy, unspecified trimester: Secondary | ICD-10-CM

## 2017-07-23 DIAGNOSIS — O99210 Obesity complicating pregnancy, unspecified trimester: Secondary | ICD-10-CM

## 2017-07-23 DIAGNOSIS — N760 Acute vaginitis: Secondary | ICD-10-CM | POA: Diagnosis not present

## 2017-07-23 LAB — URINALYSIS, ROUTINE W REFLEX MICROSCOPIC
BILIRUBIN URINE: NEGATIVE
GLUCOSE, UA: NEGATIVE mg/dL
Hgb urine dipstick: NEGATIVE
KETONES UR: 5 mg/dL — AB
LEUKOCYTES UA: NEGATIVE
Nitrite: NEGATIVE
PH: 6 (ref 5.0–8.0)
Protein, ur: 30 mg/dL — AB
Specific Gravity, Urine: 1.032 — ABNORMAL HIGH (ref 1.005–1.030)

## 2017-07-23 MED ORDER — CYCLOBENZAPRINE HCL 10 MG PO TABS: 10.0000 mg | ORAL_TABLET | Freq: Three times a day (TID) | ORAL | 0 refills | Status: DC | PRN

## 2017-07-23 MED ORDER — IBUPROFEN 600 MG PO TABS: 600.0000 mg | ORAL_TABLET | Freq: Four times a day (QID) | ORAL | 0 refills | Status: AC | PRN

## 2017-07-23 MED ORDER — IBUPROFEN 600 MG PO TABS
600.0000 mg | ORAL_TABLET | Freq: Once | ORAL | Status: AC
  Administered 2017-07-23: 600 mg via ORAL
  Filled 2017-07-23: qty 1

## 2017-07-23 NOTE — Progress Notes (Signed)
Lisa Leftwich Kirby CNM in earlier to discuss d/c plan. Written and verbal d/c instructions given and understanding voiced. 
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
## 2017-07-28 ENCOUNTER — Encounter: Payer: Self-pay | Admitting: Nurse Practitioner

## 2017-07-28 ENCOUNTER — Ambulatory Visit (INDEPENDENT_AMBULATORY_CARE_PROVIDER_SITE_OTHER): Payer: Medicaid Other | Admitting: Nurse Practitioner

## 2017-07-28 VITALS — BP 100/54 | HR 63 | Wt 258.0 lb

## 2017-07-28 DIAGNOSIS — O99210 Obesity complicating pregnancy, unspecified trimester: Secondary | ICD-10-CM

## 2017-07-28 DIAGNOSIS — O0930 Supervision of pregnancy with insufficient antenatal care, unspecified trimester: Secondary | ICD-10-CM

## 2017-07-28 DIAGNOSIS — Z3480 Encounter for supervision of other normal pregnancy, unspecified trimester: Secondary | ICD-10-CM

## 2017-07-28 DIAGNOSIS — A6000 Herpesviral infection of urogenital system, unspecified: Secondary | ICD-10-CM

## 2017-07-28 NOTE — Patient Instructions (Signed)
Second Trimester of Pregnancy The second trimester is from week 13 through week 28, month 4 through 6. This is often the time in pregnancy that you feel your best. Often times, morning sickness has lessened or quit. You may have more energy, and you may get hungry more often. Your unborn baby (fetus) is growing rapidly. At the end of the sixth month, he or she is about 9 inches long and weighs about 1 pounds. You will likely feel the baby move (quickening) between 18 and 20 weeks of pregnancy. Follow these instructions at home:  Avoid all smoking, herbs, and alcohol. Avoid drugs not approved by your doctor.  Do not use any tobacco products, including cigarettes, chewing tobacco, and electronic cigarettes. If you need help quitting, ask your doctor. You may get counseling or other support to help you quit.  Only take medicine as told by your doctor. Some medicines are safe and some are not during pregnancy.  Exercise only as told by your doctor. Stop exercising if you start having cramps.  Eat regular, healthy meals.  Wear a good support bra if your breasts are tender.  Do not use hot tubs, steam rooms, or saunas.  Wear your seat belt when driving.  Avoid raw meat, uncooked cheese, and liter boxes and soil used by cats.  Take your prenatal vitamins.  Take 1500-2000 milligrams of calcium daily starting at the 20th week of pregnancy until you deliver your baby.  Try taking medicine that helps you poop (stool softener) as needed, and if your doctor approves. Eat more fiber by eating fresh fruit, vegetables, and whole grains. Drink enough fluids to keep your pee (urine) clear or pale yellow.  Take warm water baths (sitz baths) to soothe pain or discomfort caused by hemorrhoids. Use hemorrhoid cream if your doctor approves.  If you have puffy, bulging veins (varicose veins), wear support hose. Raise (elevate) your feet for 15 minutes, 3-4 times a day. Limit salt in your diet.  Avoid heavy  lifting, wear low heals, and sit up straight.  Rest with your legs raised if you have leg cramps or low back pain.  Visit your dentist if you have not gone during your pregnancy. Use a soft toothbrush to brush your teeth. Be gentle when you floss.  You can have sex (intercourse) unless your doctor tells you not to.  Go to your doctor visits. Get help if:  You feel dizzy.  You have mild cramps or pressure in your lower belly (abdomen).  You have a nagging pain in your belly area.  You continue to feel sick to your stomach (nauseous), throw up (vomit), or have watery poop (diarrhea).  You have bad smelling fluid coming from your vagina.  You have pain with peeing (urination). Get help right away if:  You have a fever.  You are leaking fluid from your vagina.  You have spotting or bleeding from your vagina.  You have severe belly cramping or pain.  You lose or gain weight rapidly.  You have trouble catching your breath and have chest pain.  You notice sudden or extreme puffiness (swelling) of your face, hands, ankles, feet, or legs.  You have not felt the baby move in over an hour.  You have severe headaches that do not go away with medicine.  You have vision changes. This information is not intended to replace advice given to you by your health care provider. Make sure you discuss any questions you have with your health care   provider. Document Released: 03/18/2009 Document Revised: 05/30/2015 Document Reviewed: 02/23/2012 Elsevier Interactive Patient Education  2017 Elsevier Inc.  

## 2017-07-28 NOTE — Progress Notes (Signed)
Scheduled follow up u/s 8/13
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
## 2017-08-02 ENCOUNTER — Encounter: Payer: Self-pay | Admitting: Nurse Practitioner

## 2017-08-02 ENCOUNTER — Other Ambulatory Visit: Payer: Self-pay | Admitting: Nurse Practitioner

## 2017-08-02 MED ORDER — TRIAMCINOLONE ACETONIDE 0.025 % EX OINT: 1.0000 "application " | TOPICAL_OINTMENT | Freq: Two times a day (BID) | CUTANEOUS | 1 refills | Status: DC

## 2017-08-02 NOTE — Progress Notes (Signed)
Responding to client request for cream for eczema worsening in pregnancy and unresponsive to OTC hydrocortisone cream.  Sent triamcinalone to her pharmacy and MyChart message sent to client.  Nolene BernheimERRI AmeLie Hollars, RN, MSN, NP-BC Nurse Practitioner, [facility], [facility] 08/02/2017 5:47 PM

## 2017-08-17 ENCOUNTER — Ambulatory Visit (HOSPITAL_COMMUNITY)
Admission: RE | Admit: 2017-08-17 | Discharge: 2017-08-17 | Disposition: A | Discharge status: Home / Self Care | Payer: Medicaid Other | Source: Ambulatory Visit | Attending: Nurse Practitioner | Admitting: Nurse Practitioner

## 2017-08-17 ENCOUNTER — Encounter (HOSPITAL_COMMUNITY): Payer: Self-pay

## 2017-08-17 DIAGNOSIS — O0932 Supervision of pregnancy with insufficient antenatal care, second trimester: Secondary | ICD-10-CM | POA: Diagnosis not present

## 2017-08-17 DIAGNOSIS — Z3A25 25 weeks gestation of pregnancy: Secondary | ICD-10-CM | POA: Diagnosis not present

## 2017-08-17 DIAGNOSIS — O99210 Obesity complicating pregnancy, unspecified trimester: Secondary | ICD-10-CM

## 2017-08-17 DIAGNOSIS — Z3482 Encounter for supervision of other normal pregnancy, second trimester: Secondary | ICD-10-CM | POA: Insufficient documentation

## 2017-08-17 DIAGNOSIS — O99212 Obesity complicating pregnancy, second trimester: Secondary | ICD-10-CM | POA: Insufficient documentation

## 2017-08-17 DIAGNOSIS — Z3480 Encounter for supervision of other normal pregnancy, unspecified trimester: Secondary | ICD-10-CM

## 2017-08-17 DIAGNOSIS — Z362 Encounter for other antenatal screening follow-up: Secondary | ICD-10-CM

## 2017-08-17 DIAGNOSIS — O0930 Supervision of pregnancy with insufficient antenatal care, unspecified trimester: Secondary | ICD-10-CM

## 2017-08-26 ENCOUNTER — Other Ambulatory Visit: Payer: Self-pay | Admitting: Certified Nurse Midwife

## 2017-08-26 ENCOUNTER — Ambulatory Visit (INDEPENDENT_AMBULATORY_CARE_PROVIDER_SITE_OTHER): Payer: Medicaid Other | Admitting: Certified Nurse Midwife

## 2017-08-26 ENCOUNTER — Encounter: Payer: Self-pay | Admitting: Certified Nurse Midwife

## 2017-08-26 VITALS — BP 107/64 | HR 88 | Wt 262.8 lb

## 2017-08-26 DIAGNOSIS — A6000 Herpesviral infection of urogenital system, unspecified: Secondary | ICD-10-CM

## 2017-08-26 DIAGNOSIS — O99210 Obesity complicating pregnancy, unspecified trimester: Secondary | ICD-10-CM

## 2017-08-26 DIAGNOSIS — Z3480 Encounter for supervision of other normal pregnancy, unspecified trimester: Secondary | ICD-10-CM
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
## 2017-08-26 NOTE — Patient Instructions (Signed)

## 2017-08-27 LAB — CBC
Hematocrit: 37.5 % (ref 34.0–46.6)
Hemoglobin: 12.2 g/dL (ref 11.1–15.9)
MCH: 30.2 pg (ref 26.6–33.0)
MCHC: 32.5 g/dL (ref 31.5–35.7)
MCV: 93 fL (ref 79–97)
Platelets: 306 10*3/uL (ref 150–450)
RBC: 4.04 x10E6/uL (ref 3.77–5.28)
RDW: 14.2 % (ref 12.3–15.4)
WBC: 9.9 10*3/uL (ref 3.4–10.8)

## 2017-08-27 LAB — GLUCOSE TOLERANCE, 2 HOURS W/ 1HR
Glucose, 1 hour: 101 mg/dL (ref 65–179)
Glucose, 2 hour: 84 mg/dL (ref 65–152)
Glucose, Fasting: 73 mg/dL (ref 65–91)

## 2017-08-27 LAB — RPR: RPR Ser Ql: NONREACTIVE

## 2017-08-27 LAB — HIV ANTIBODY (ROUTINE TESTING W REFLEX): HIV Screen 4th Generation wRfx: NONREACTIVE

## 2017-09-24 ENCOUNTER — Ambulatory Visit (INDEPENDENT_AMBULATORY_CARE_PROVIDER_SITE_OTHER): Payer: Medicaid Other | Admitting: Internal Medicine

## 2017-09-24 VITALS — BP 116/69 | HR 81 | Wt 273.0 lb

## 2017-09-24 DIAGNOSIS — A6000 Herpesviral infection of urogenital system, unspecified: Secondary | ICD-10-CM

## 2017-09-24 DIAGNOSIS — O99210 Obesity complicating pregnancy, unspecified trimester: Secondary | ICD-10-CM

## 2017-09-24 DIAGNOSIS — Z3480 Encounter for supervision of other normal pregnancy, unspecified trimester: Secondary | ICD-10-CM
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
## 2017-09-24 NOTE — Patient Instructions (Signed)
Call the office or go to [facility] if:  You begin to have strong, frequent contractions  Your water breaks.  Sometimes it is a big gush of fluid, sometimes it is just a trickle that keeps getting your panties wet or running down your legs  You have vaginal bleeding.  It is normal to have a small amount of spotting if your cervix was checked.   You don't feel your baby moving like normal.  If you don't, get you something to eat and drink and lay down and focus on feeling your baby move.  You should feel at least 10 movements in 2 hours.  If you don't, you should call the office or go to [facility].    Tdap Vaccine  It is recommended that you get the Tdap vaccine during the third trimester of EACH pregnancy to help protect your baby from getting pertussis (whooping cough)  27-36 weeks is the BEST time to do this so that you can pass the protection on to your baby. During pregnancy is better than after pregnancy, but if you are unable to get it during pregnancy it will be offered at the hospital.   You can get this vaccine with us, at the health department, your family doctor, or some local pharmacies  Everyone who will be around your baby should also be up-to-date on their vaccines before the baby comes. Adults (who are not pregnant) only need 1 dose of Tdap during adulthood.   Third Trimester of Pregnancy The third trimester is from week 29 through week 42, months 7 through 9. The third trimester is a time when the fetus is growing rapidly. At the end of the ninth month, the fetus is about 20 inches in length and weighs 6-10 pounds.  BODY CHANGES Your body goes through many changes during pregnancy. The changes vary from woman to woman.   Your weight will continue to increase. You can expect to gain 25-35 pounds (11-16 kg) by the end of the pregnancy.  You may begin to get stretch marks on your hips, abdomen, and breasts.  You may urinate more often because the fetus is  moving lower into your pelvis and pressing on your bladder.  You may develop or continue to have heartburn as a result of your pregnancy.  You may develop constipation because certain hormones are causing the muscles that push waste through your intestines to slow down.  You may develop hemorrhoids or swollen, bulging veins (varicose veins).  You may have pelvic pain because of the weight gain and pregnancy hormones relaxing your joints between the bones in your pelvis. Backaches may result from overexertion of the muscles supporting your posture.  You may have changes in your hair. These can include thickening of your hair, rapid growth, and changes in texture. Some women also have hair loss during or after pregnancy, or hair that feels dry or thin. Your hair will most likely return to normal after your baby is born.  Your breasts will continue to grow and be tender. A yellow discharge may leak from your breasts called colostrum.  Your belly button may stick out.  You may feel short of breath because of your expanding uterus.  You may notice the fetus "dropping," or moving lower in your abdomen.  You may have a bloody mucus discharge. This usually occurs a few days to a week before labor begins.  Your cervix becomes thin and soft (effaced) near your due date. WHAT TO EXPECT AT   YOUR PRENATAL EXAMS  You will have prenatal exams every 2 weeks until week 89. Then, you will have weekly prenatal exams. During a routine prenatal visit:  You will be weighed to make sure you and the fetus are growing normally.  Your blood pressure is taken.  Your abdomen will be measured to track your baby's growth.  The fetal heartbeat will be listened to.  Any test results from the previous visit will be discussed.  You may have a cervical check near your due date to see if you have effaced. At around 36 weeks, your caregiver will check your cervix. At the same time, your caregiver will also perform a  test on the secretions of the vaginal tissue. This test is to determine if a type of bacteria, Group B streptococcus, is present. Your caregiver will explain this further. Your caregiver may ask you:  What your birth plan is.  How you are feeling.  If you are feeling the baby move.  If you have had any abnormal symptoms, such as leaking fluid, bleeding, severe headaches, or abdominal cramping.  If you have any questions. Other tests or screenings that may be performed during your third trimester include:  Blood tests that check for low iron levels (anemia).  Fetal testing to check the health, activity level, and growth of the fetus. Testing is done if you have certain medical conditions or if there are problems during the pregnancy. FALSE LABOR You may feel small, irregular contractions that eventually go away. These are called Braxton Hicks contractions, or false labor. Contractions may last for hours, days, or even weeks before true labor sets in. If contractions come at regular intervals, intensify, or become painful, it is best to be seen by your caregiver.  SIGNS OF LABOR   Menstrual-like cramps.  Contractions that are 5 minutes apart or less.  Contractions that start on the top of the uterus and spread down to the lower abdomen and back.  A sense of increased pelvic pressure or back pain.  A watery or bloody mucus discharge that comes from the vagina. If you have any of these signs before the 37th week of pregnancy, call your caregiver right away. You need to go to the hospital to get checked immediately. HOME CARE INSTRUCTIONS   Avoid all smoking, herbs, alcohol, and unprescribed drugs. These chemicals affect the formation and growth of the baby.  Follow your caregiver's instructions regarding medicine use. There are medicines that are either safe or unsafe to take during pregnancy.  Exercise only as directed by your caregiver. Experiencing uterine cramps is a good sign to  stop exercising.  Continue to eat regular, healthy meals.  Wear a good support bra for breast tenderness.  Do not use hot tubs, steam rooms, or saunas.  Wear your seat belt at all times when driving.  Avoid raw meat, uncooked cheese, cat litter boxes, and soil used by cats. These carry germs that can cause birth defects in the baby.  Take your prenatal vitamins.  Try taking a stool softener (if your caregiver approves) if you develop constipation. Eat more high-fiber foods, such as fresh vegetables or fruit and whole grains. Drink plenty of fluids to keep your urine clear or pale yellow.  Take warm sitz baths to soothe any pain or discomfort caused by hemorrhoids. Use hemorrhoid cream if your caregiver approves.  If you develop varicose veins, wear support hose. Elevate your feet for 15 minutes, 3-4 times a day. Limit salt in your  diet.  Avoid heavy lifting, wear low heal shoes, and practice good posture.  Rest a lot with your legs elevated if you have leg cramps or low back pain.  Visit your dentist if you have not gone during your pregnancy. Use a soft toothbrush to brush your teeth and be gentle when you floss.  A sexual relationship may be continued unless your caregiver directs you otherwise.  Do not travel far distances unless it is absolutely necessary and only with the approval of your caregiver.  Take prenatal classes to understand, practice, and ask questions about the labor and delivery.  Make a trial run to the hospital.  Pack your hospital bag.  Prepare the baby's nursery.  Continue to go to all your prenatal visits as directed by your caregiver. SEEK MEDICAL CARE IF:  You are unsure if you are in labor or if your water has broken.  You have dizziness.  You have mild pelvic cramps, pelvic pressure, or nagging pain in your abdominal area.  You have persistent nausea, vomiting, or diarrhea.  You have a bad smelling vaginal discharge.  You have pain with  urination. SEEK IMMEDIATE MEDICAL CARE IF:   You have a fever.  You are leaking fluid from your vagina.  You have spotting or bleeding from your vagina.  You have severe abdominal cramping or pain.  You have rapid weight loss or gain.  You have shortness of breath with chest pain.  You notice sudden or extreme swelling of your face, hands, ankles, feet, or legs.  You have not felt your baby move in over an hour.  You have severe headaches that do not go away with medicine.  You have vision changes. Document Released: 12/16/2000 Document Revised: 12/27/2012 Document Reviewed: 02/23/2012 [facility] Patient Information 2015 [place], [place]. This information is not intended to replace advice given to you by your health care provider. Make sure you discuss any questions you have with your health care provider.

## 2017-09-24 NOTE — Progress Notes (Signed)
Declines flu/ tdap.

## 2017-09-26 ENCOUNTER — Inpatient Hospital Stay (HOSPITAL_COMMUNITY)
Admission: AD | Admit: 2017-09-26 | Discharge: 2017-09-26 | Disposition: A | Discharge status: Home / Self Care | Payer: Medicaid Other | Source: Ambulatory Visit | Attending: Family Medicine | Admitting: Family Medicine

## 2017-09-26 ENCOUNTER — Encounter (HOSPITAL_COMMUNITY): Payer: Self-pay | Admitting: *Deleted

## 2017-09-26 DIAGNOSIS — O99513 Diseases of the respiratory system complicating pregnancy, third trimester: Secondary | ICD-10-CM | POA: Insufficient documentation

## 2017-09-26 DIAGNOSIS — J069 Acute upper respiratory infection, unspecified: Secondary | ICD-10-CM

## 2017-09-26 DIAGNOSIS — Z3A30 30 weeks gestation of pregnancy: Secondary | ICD-10-CM | POA: Insufficient documentation

## 2017-09-26 DIAGNOSIS — J00 Acute nasopharyngitis [common cold]: Secondary | ICD-10-CM | POA: Diagnosis present

## 2017-09-26 DIAGNOSIS — O26893 Other specified pregnancy related conditions, third trimester: Secondary | ICD-10-CM

## 2017-09-26 DIAGNOSIS — Z87891 Personal history of nicotine dependence: Secondary | ICD-10-CM | POA: Insufficient documentation

## 2017-09-26 MED ORDER — HYDROCOD POLST-CPM POLST ER 10-8 MG/5ML PO SUER: 5.0000 mL | Freq: Two times a day (BID) | ORAL | 0 refills | Status: DC | PRN

## 2017-09-26 MED ORDER — AZITHROMYCIN 250 MG PO TABS
ORAL_TABLET | ORAL | 0 refills | Status: DC
Start: 2017-09-26 — End: 2017-11-01

## 2017-09-26 NOTE — Discharge Instructions (Signed)

## 2017-09-26 NOTE — MAU Provider Note (Signed)
Chief Complaint: Nasal Congestion and Cough   First Provider Initiated Contact with Patient 09/26/17 1240      SUBJECTIVE HPI: Ms. Logyn Dedominicis is a 27 y.o. G2P1001 at [redacted]w[redacted]d weeks gestation who presents to maternity admissions reporting a cold 2 weekends ago. She thought it was getting better, but now she is having worsening sx's. She reports congestion, sinus pressure and a productive cough with yellow phlegm. She denies fever. She has no OB complaints today; denies VB or LOF. She reports good (+) FM.    Past Medical History:  Diagnosis Date  . BV (bacterial vaginosis)   . Genital herpes    Past Surgical History:  Procedure Laterality Date  . WISDOM TOOTH EXTRACTION     Social History   Socioeconomic History  . Marital status: Single    Spouse name: Not on file  . Number of children: Not on file  . Years of education: Not on file  . Highest education level: Not on file  Occupational History  . Not on file  Social Needs  . Financial resource strain: Not on file  . Food insecurity:    Worry: Not on file    Inability: Not on file  . Transportation needs:    Medical: Not on file    Non-medical: Not on file  Tobacco Use  . Smoking status: Former Games developer  . Smokeless tobacco: Never Used  Substance and Sexual Activity  . Alcohol use: Not Currently    Comment: quit with positive HPT (03/22/2017)  . Drug use: Yes    Types: Marijuana    Comment: pt states she quit with this positive HPT  . Sexual activity: Yes    Birth control/protection: None  Lifestyle  . Physical activity:    Days per week: Not on file    Minutes per session: Not on file  . Stress: Not on file  Relationships  . Social connections:    Talks on phone: Not on file    Gets together: Not on file    Attends religious service: Not on file    Active member of club or organization: Not on file    Attends meetings of clubs or organizations: Not on file    Relationship status: Not on file  . Intimate partner  violence:    Fear of current or ex partner: Not on file    Emotionally abused: Not on file    Physically abused: Not on file    Forced sexual activity: Not on file  Other Topics Concern  . Not on file  Social History Narrative  . Not on file   No current facility-administered medications on file prior to encounter.    Current Outpatient Medications on File Prior to Encounter  Medication Sig Dispense Refill  . cyclobenzaprine (FLEXERIL) 10 MG tablet Take 1 tablet (10 mg total) by mouth 3 (three) times daily as needed for muscle spasms. (Patient not taking: Reported on 09/24/2017) 20 tablet 0  . ondansetron (ZOFRAN-ODT) 8 MG disintegrating tablet Take 8 mg by mouth every 8 (eight) hours as needed for nausea or vomiting.    . Prenatal Vit-Fe Fumarate-FA (PRENATAL MULTIVITAMIN) TABS tablet Take 1 tablet by mouth daily at 12 noon.     . triamcinolone (KENALOG) 0.025 % ointment Apply 1 application topically 2 (two) times daily. 80 g 1   No Known Allergies  ROS:  Review of Systems  Constitutional: Negative.   HENT: Positive for congestion and sinus pressure.   Eyes: Negative.  Respiratory: Positive for cough (productive with yellow colored mucous).   Cardiovascular: Negative.   Gastrointestinal: Negative.   Endocrine: Negative.   Genitourinary: Negative.   Musculoskeletal: Negative.   Skin: Negative.   Allergic/Immunologic: Negative.   Neurological: Negative.   Hematological: Negative.   Psychiatric/Behavioral: Negative.     I have reviewed patient's Past Medical Hx, Surgical Hx, Family Hx, Social Hx, medications and allergies.   Physical Exam   Patient Vitals for the past 24 hrs:  BP Temp Temp src Pulse Resp SpO2 Weight  09/26/17 1145 113/64 (!) 97.5 F (36.4 C) Oral 77 18 99 % 124.7 kg   Physical Exam  Nursing note and vitals reviewed. Constitutional: She is oriented to person, place, and time. She appears well-developed and well-nourished.  HENT:  Head: Normocephalic  and atraumatic.  Eyes: Pupils are equal, round, and reactive to light.  Neck: Normal range of motion.  Cardiovascular: Normal rate, regular rhythm and normal heart sounds.  Respiratory: Effort normal and breath sounds normal.  GI: Soft. Bowel sounds are normal.  Genitourinary:  Genitourinary Comments: Pelvic deferred  Musculoskeletal: Normal range of motion.  Neurological: She is alert and oriented to person, place, and time.  Skin: Skin is warm and dry.  Psychiatric: She has a normal mood and affect. Her behavior is normal. Judgment and thought content normal.    MDM Patient denies any concerning symptoms in need of emergent evaluation. Patient advised that rx can be sent to treat URI or she may choose to be discharged to [facility]emerg[facility] or her PCP.   ASSESSMENT MSE Complete URI  PLAN Discharge patient at her request to pick up Rx for Zpack and Tussionex syrup from pharmacy on file. Keep scheduled appointments. Patient verbalized an understanding of the plan of care and agrees.   Raelyn MoraDawson, Torell Minder, CNM 09/26/2017 12:40 PM

## 2017-09-26 NOTE — MAU Note (Signed)
Joy [name] is a 27 y.o. at 807w5d here in MAU reporting: that she noticed a cold 2 weekends ago. Seemed like it was getting better. However still noticing congestion, sinus pressure,  and productive cough--yellow in color. Denies fever. Onset of complaint: past 2 weeks Denies any ob complaints of LOF, vb, or contractions. +FM Pain score: denies Vitals:   09/26/17 1145  BP: 113/64  Pulse: 77  Resp: 18  Temp: (!) 97.5 F (36.4 C)  SpO2: 99%    FHT:144 via doppler Lab orders placed from triage: none

## 2017-09-28 ENCOUNTER — Encounter (HOSPITAL_COMMUNITY): Payer: Self-pay

## 2017-09-28 ENCOUNTER — Other Ambulatory Visit (HOSPITAL_COMMUNITY): Payer: Self-pay | Admitting: *Deleted

## 2017-09-28 ENCOUNTER — Ambulatory Visit (HOSPITAL_COMMUNITY)
Admission: RE | Admit: 2017-09-28 | Discharge: 2017-09-28 | Disposition: A | Discharge status: Home / Self Care | Payer: Medicaid Other | Source: Ambulatory Visit | Attending: Certified Nurse Midwife | Admitting: Certified Nurse Midwife

## 2017-09-28 DIAGNOSIS — B009 Herpesviral infection, unspecified: Secondary | ICD-10-CM | POA: Diagnosis not present

## 2017-09-28 DIAGNOSIS — O98513 Other viral diseases complicating pregnancy, third trimester: Secondary | ICD-10-CM | POA: Insufficient documentation

## 2017-09-28 DIAGNOSIS — Z362 Encounter for other antenatal screening follow-up: Secondary | ICD-10-CM | POA: Insufficient documentation

## 2017-09-28 DIAGNOSIS — Z3A31 31 weeks gestation of pregnancy: Secondary | ICD-10-CM | POA: Diagnosis not present

## 2017-09-28 DIAGNOSIS — O0933 Supervision of pregnancy with insufficient antenatal care, third trimester: Secondary | ICD-10-CM | POA: Insufficient documentation

## 2017-09-28 DIAGNOSIS — O99210 Obesity complicating pregnancy, unspecified trimester: Secondary | ICD-10-CM

## 2017-09-28 DIAGNOSIS — O99213 Obesity complicating pregnancy, third trimester: Secondary | ICD-10-CM | POA: Insufficient documentation

## 2017-10-08 ENCOUNTER — Ambulatory Visit (INDEPENDENT_AMBULATORY_CARE_PROVIDER_SITE_OTHER): Payer: Medicaid Other | Admitting: Student

## 2017-10-08 VITALS — BP 117/58 | HR 85 | Wt 273.1 lb

## 2017-10-08 DIAGNOSIS — Z3483 Encounter for supervision of other normal pregnancy, third trimester: Secondary | ICD-10-CM

## 2017-10-08 DIAGNOSIS — Z3480 Encounter for supervision of other normal pregnancy, unspecified trimester: Secondary | ICD-10-CM

## 2017-10-08 NOTE — Patient Instructions (Signed)
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
## 2017-10-22 ENCOUNTER — Ambulatory Visit (INDEPENDENT_AMBULATORY_CARE_PROVIDER_SITE_OTHER): Payer: Medicaid Other | Admitting: Student

## 2017-10-22 VITALS — BP 131/66 | HR 102 | Wt 278.9 lb

## 2017-10-22 DIAGNOSIS — Z3480 Encounter for supervision of other normal pregnancy, unspecified trimester: Secondary | ICD-10-CM

## 2017-10-22 DIAGNOSIS — A6000 Herpesviral infection of urogenital system, unspecified: Secondary | ICD-10-CM

## 2017-10-22 MED ORDER — VALACYCLOVIR HCL 500 MG PO TABS: 500.0000 mg | ORAL_TABLET | Freq: Two times a day (BID) | ORAL | 6 refills | Status: DC

## 2017-10-22 NOTE — Patient Instructions (Signed)
Before Baby Comes Home  Before your baby arrives it is important to:   Have all of the supplies that you will need to care for your baby.   Know where to go if there is an emergency.   Discuss the baby's arrival with other family members.    What supplies will I need?    It is recommended that you have the following supplies:  Large Items   Crib.   Crib mattress.   Rear-facing infant car seat. If possible, have a trained professional check to make sure that it is installed correctly.    Feeding   6-8 bottles that are 4-5 oz in size.   6-8 nipples.   Bottle brush.   Sterilizer, or a large pan or kettle with a lid.   A way to boil and cool water.   If you will be breastfeeding:  ? Breast pump.  ? Nipple cream.  ? Nursing bra.  ? Breast pads.  ? Breast shields.   If you will be formula feeding:  ? Formula.  ? Measuring cups.  ? Measuring spoons.    Bathing   Mild baby soap and baby shampoo.   Petroleum jelly.   Soft cloth towel and washcloth.   Hooded towel.   Cotton balls.   Bath basin.    Other Supplies   Rectal thermometer.   Bulb syringe.   Baby wipes or washcloths for diaper changes.   Diaper bag.   Changing pad.   Clothing, including one-piece outfits and pajamas.   Baby nail clippers.   Receiving blankets.   Mattress pad and sheets for the crib.   Night-light for the baby's room.   Baby monitor.   2 or 3 pacifiers.   Either 24-36 cloth diapers and waterproof diaper covers or a box of disposable diapers. You may need to use as many as 10-12 diapers per day.    How do I prepare for an emergency?  Prepare for an emergency by:   Knowing how to get to the nearest hospital.   Listing the phone numbers of your baby's health care providers near your home phone and in your cell phone.    How do I prepare my family?   Decide how to handle visitors.   If you have other children:  ? Talk with them about the baby coming home. Ask them how they feel about it.  ? Read a book together about  being a new big brother or sister.  ? Find ways to let them help you prepare for the new baby.  ? Have someone ready to care for them while you are in the hospital.  This information is not intended to replace advice given to you by your health care provider. Make sure you discuss any questions you have with your health care provider.  Document Released: 12/05/2007 Document Revised: 05/30/2015 Document Reviewed: 11/29/2013  Elsevier Interactive Patient Education  2018 [employer] Inc[employer]
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
## 2017-10-29 ENCOUNTER — Encounter: Payer: Medicaid Other | Admitting: Student

## 2017-11-01 ENCOUNTER — Ambulatory Visit (INDEPENDENT_AMBULATORY_CARE_PROVIDER_SITE_OTHER): Payer: Medicaid Other | Admitting: Family Medicine

## 2017-11-01 VITALS — BP 113/51 | HR 95 | Wt 281.2 lb

## 2017-11-01 DIAGNOSIS — A6000 Herpesviral infection of urogenital system, unspecified: Secondary | ICD-10-CM

## 2017-11-01 DIAGNOSIS — Z3480 Encounter for supervision of other normal pregnancy, unspecified trimester: Secondary | ICD-10-CM
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
## 2017-11-01 NOTE — Patient Instructions (Signed)
Research childbirth classes and hospital preregistration at [email or web address]  Fetal Movement Counts Patient Name: ________________________________________________ Patient Due Date: ____________________ What is a fetal movement count? A fetal movement count is the number of times that you feel your baby move during a certain amount of time. This may also be called a fetal kick count. A fetal movement count is recommended for every pregnant woman. You may be asked to start counting fetal movements as early as week 28 of your pregnancy. Pay attention to when your baby is most active. You may notice your baby's sleep and wake cycles. You may also notice things that make your baby move more. You should do a fetal movement count:  When your baby is normally most active.  At the same time each day.  A good time to count movements is while you are resting, after having something to eat and drink. How do I count fetal movements? 1. Find a quiet, comfortable area. Sit, or lie down on your side. 2. Write down the date, the start time and stop time, and the number of movements that you felt between those two times. Take this information with you to your health care visits. 3. For 2 hours, count kicks, flutters, swishes, rolls, and jabs. You should feel at least 10 movements during 2 hours. 4. You may stop counting after you have felt 10 movements. 5. If you do not feel 10 movements in 2 hours, have something to eat and drink. Then, keep resting and counting for 1 hour. If you feel at least 4 movements during that hour, you may stop counting. Contact a health care provider if:  You feel fewer than 4 movements in 2 hours.  Your baby is not moving like he or she usually does. Date: ____________ Start time: ____________ Stop time: ____________ Movements: ____________ Date: ____________ Start time: ____________ Stop time: ____________ Movements: ____________ Date: ____________ Start time: ____________  Stop time: ____________ Movements: ____________ Date: ____________ Start time: ____________ Stop time: ____________ Movements: ____________ Date: ____________ Start time: ____________ Stop time: ____________ Movements: ____________ Date: ____________ Start time: ____________ Stop time: ____________ Movements: ____________ Date: ____________ Start time: ____________ Stop time: ____________ Movements: ____________ Date: ____________ Start time: ____________ Stop time: ____________ Movements: ____________ Date: ____________ Start time: ____________ Stop time: ____________ Movements: ____________ This information is not intended to replace advice given to you by your health care provider. Make sure you discuss any questions you have with your health care provider. Document Released: 01/21/2006 Document Revised: 08/21/2015 Document Reviewed: 01/31/2015 Elsevier Interactive Patient Education  2018 [employer]  Braxton Hicks Contractions Contractions of the uterus can occur throughout pregnancy, but they are not always a sign that you are in labor. You may have practice contractions called Braxton Hicks contractions. These false labor contractions are sometimes confused with true labor. What are Braxton Hicks contractions? Braxton Hicks contractions are tightening movements that occur in the muscles of the uterus before labor. Unlike true labor contractions, these contractions do not result in opening (dilation) and thinning of the cervix. Toward the end of pregnancy (32-34 weeks), Braxton Hicks contractions can happen more often and may become stronger. These contractions are sometimes difficult to tell apart from true labor because they can be very uncomfortable. You should not feel embarrassed if you go to the hospital with false labor. Sometimes, the only way to tell if you are in true labor is for your health care provider to look for changes in the cervix. The health care provider will   do a physical  exam and may monitor your contractions. If you are not in true labor, the exam should show that your cervix is not dilating and your water has not broken. If there are other health problems associated with your pregnancy, it is completely safe for you to be sent home with false labor. You may continue to have Braxton Hicks contractions until you go into true labor. How to tell the difference between true labor and false labor True labor  Contractions last 30-70 seconds.  Contractions become very regular.  Discomfort is usually felt in the top of the uterus, and it spreads to the lower abdomen and low back.  Contractions do not go away with walking.  Contractions usually become more intense and increase in frequency.  The cervix dilates and gets thinner. False labor  Contractions are usually shorter and not as strong as true labor contractions.  Contractions are usually irregular.  Contractions are often felt in the front of the lower abdomen and in the groin.  Contractions may go away when you walk around or change positions while lying down.  Contractions get weaker and are shorter-lasting as time goes on.  The cervix usually does not dilate or become thin. Follow these instructions at home:  Take over-the-counter and prescription medicines only as told by your health care provider.  Keep up with your usual exercises and follow other instructions from your health care provider.  Eat and drink lightly if you think you are going into labor.  If Braxton Hicks contractions are making you uncomfortable: ? Change your position from lying down or resting to walking, or change from walking to resting. ? Sit and rest in a tub of warm water. ? Drink enough fluid to keep your urine pale yellow. Dehydration may cause these contractions. ? Do slow and deep breathing several times an hour.  Keep all follow-up prenatal visits as told by your health care provider. This is  important. Contact a health care provider if:  You have a fever.  You have continuous pain in your abdomen. Get help right away if:  Your contractions become stronger, more regular, and closer together.  You have fluid leaking or gushing from your vagina.  You pass blood-tinged mucus (bloody show).  You have bleeding from your vagina.  You have low back pain that you never had before.  You feel your baby's head pushing down and causing pelvic pressure.  Your baby is not moving inside you as much as it used to. Summary  Contractions that occur before labor are called Braxton Hicks contractions, false labor, or practice contractions.  Braxton Hicks contractions are usually shorter, weaker, farther apart, and less regular than true labor contractions. True labor contractions usually become progressively stronger and regular and they become more frequent.  Manage discomfort from Braxton Hicks contractions by changing position, resting in a warm bath, drinking plenty of water, or practicing deep breathing. This information is not intended to replace advice given to you by your health care provider. Make sure you discuss any questions you have with your health care provider. Document Released: 05/07/2016 Document Revised: 05/07/2016 Document Reviewed: 05/07/2016 Elsevier Interactive Patient Education  2018 [employer]    

## 2017-11-02 ENCOUNTER — Ambulatory Visit (HOSPITAL_COMMUNITY)
Admission: RE | Admit: 2017-11-02 | Discharge: 2017-11-02 | Disposition: A | Discharge status: Home / Self Care | Payer: Medicaid Other | Source: Ambulatory Visit | Attending: Certified Nurse Midwife | Admitting: Certified Nurse Midwife

## 2017-11-02 DIAGNOSIS — O0933 Supervision of pregnancy with insufficient antenatal care, third trimester: Secondary | ICD-10-CM | POA: Diagnosis not present

## 2017-11-02 DIAGNOSIS — Z3A36 36 weeks gestation of pregnancy: Secondary | ICD-10-CM | POA: Diagnosis not present

## 2017-11-02 DIAGNOSIS — Z362 Encounter for other antenatal screening follow-up: Secondary | ICD-10-CM

## 2017-11-02 DIAGNOSIS — O99213 Obesity complicating pregnancy, third trimester: Secondary | ICD-10-CM

## 2017-11-02 DIAGNOSIS — O99210 Obesity complicating pregnancy, unspecified trimester: Secondary | ICD-10-CM | POA: Diagnosis present

## 2017-11-04 ENCOUNTER — Other Ambulatory Visit: Payer: Self-pay | Admitting: Family Medicine

## 2017-11-08 ENCOUNTER — Other Ambulatory Visit (HOSPITAL_COMMUNITY)
Admission: RE | Admit: 2017-11-08 | Discharge: 2017-11-08 | Disposition: A | Discharge status: Home / Self Care | Payer: Medicaid Other | Source: Ambulatory Visit | Attending: Advanced Practice Midwife | Admitting: Advanced Practice Midwife

## 2017-11-08 ENCOUNTER — Encounter: Payer: Self-pay | Admitting: Advanced Practice Midwife

## 2017-11-08 ENCOUNTER — Ambulatory Visit (INDEPENDENT_AMBULATORY_CARE_PROVIDER_SITE_OTHER): Payer: Medicaid Other | Admitting: Advanced Practice Midwife

## 2017-11-08 VITALS — BP 129/64 | HR 89 | Wt 283.5 lb

## 2017-11-08 DIAGNOSIS — Z3480 Encounter for supervision of other normal pregnancy, unspecified trimester: Secondary | ICD-10-CM | POA: Diagnosis present

## 2017-11-08 LAB — OB RESULTS CONSOLE GBS: GBS: NEGATIVE

## 2017-11-08 LAB — OB RESULTS CONSOLE GC/CHLAMYDIA: Gonorrhea: NEGATIVE
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
## 2017-11-08 NOTE — Patient Instructions (Signed)
Vaginal delivery means that you will give birth by pushing your baby out of your birth canal (vagina). A team of health care providers will help you before, during, and after vaginal delivery. Birth experiences are unique for every woman and every pregnancy, and birth experiences vary depending on where you choose to give birth. What should I do to prepare for my baby's birth? Before your baby is born, it is important to talk with your health care provider about:  Your labor and delivery preferences. These may include: ? Medicines that you may be given. ? How you will manage your pain. This might include non-medical pain relief techniques or injectable pain relief such as epidural analgesia. ? How you and your baby will be monitored during labor and delivery. ? Who may be in the labor and delivery room with you. ? Your feelings about surgical delivery of your baby (cesarean delivery, or C-section) if this becomes necessary. ? Your feelings about receiving donated blood through an IV tube (blood transfusion) if this becomes necessary.  Whether you are able: ? To take pictures or videos of the birth. ? To eat during labor and delivery. ? To move around, walk, or change positions during labor and delivery.  What to expect after your baby is born, such as: ? Whether delayed umbilical cord clamping and cutting is offered. ? Who will care for your baby right after birth. ? Medicines or tests that may be recommended for your baby. ? Whether breastfeeding is supported in your hospital or birth center. ? How long you will be in the hospital or birth center.  How any medical conditions you have may affect your baby or your labor and delivery experience.  To prepare for your baby's birth, you should also:  Attend all of your health care visits before delivery (prenatal visits) as recommended by your health care provider. This is important.  Prepare your home for your baby's arrival. Make sure  that you have: ? Diapers. ? Baby clothing. ? Feeding equipment. ? Safe sleeping arrangements for you and your baby.  Install a car seat in your vehicle. Have your car seat checked by a certified car seat installer to make sure that it is installed safely.  Think about who will help you with your new baby at home for at least the first several weeks after delivery.  What can I expect when I arrive at the birth center or hospital? Once you are in labor and have been admitted into the hospital or birth center, your health care provider may:  Review your pregnancy history and any concerns you have.  Insert an IV tube into one of your veins. This is used to give you fluids and medicines.  Check your blood pressure, pulse, temperature, and heart rate (vital signs).  Check whether your bag of water (amniotic sac) has broken (ruptured).  Talk with you about your birth plan and discuss pain control options.  Monitoring Your health care provider may monitor your contractions (uterine monitoring) and your baby's heart rate (fetal monitoring). You may need to be monitored:  Often, but not continuously (intermittently).  All the time or for long periods at a time (continuously). Continuous monitoring may be needed if: ? You are taking certain medicines, such as medicine to relieve pain or make your contractions stronger. ? You have pregnancy or labor complications.  Monitoring may be done by:  Placing a special stethoscope or a handheld monitoring device on your abdomen to check your   baby's heartbeat, and feeling your abdomen for contractions. This method of monitoring does not continuously record your baby's heartbeat or your contractions.  Placing monitors on your abdomen (external monitors) to record your baby's heartbeat and the frequency and length of contractions. You may not have to wear external monitors all the time.  Placing monitors inside of your uterus (internal monitors) to  record your baby's heartbeat and the frequency, length, and strength of your contractions. ? Your health care provider may use internal monitors if he or she needs more information about the strength of your contractions or your baby's heart rate. ? Internal monitors are put in place by passing a thin, flexible wire through your vagina and into your uterus. Depending on the type of monitor, it may remain in your uterus or on your baby's head until birth. ? Your health care provider will discuss the benefits and risks of internal monitoring with you and will ask for your permission before inserting the monitors.  Telemetry. This is a type of continuous monitoring that can be done with external or internal monitors. Instead of having to stay in bed, you are able to move around during telemetry. Ask your health care provider if telemetry is an option for you.  Physical exam Your health care provider may perform a physical exam. This may include:  Checking whether your baby is positioned: ? With the head toward your vagina (head-down). This is most common. ? With the head toward the top of your uterus (head-up or breech). If your baby is in a breech position, your health care provider may try to turn your baby to a head-down position so you can deliver vaginally. If it does not seem that your baby can be born vaginally, your provider may recommend surgery to deliver your baby. In rare cases, you may be able to deliver vaginally if your baby is head-up (breech delivery). ? Lying sideways (transverse). Babies that are lying sideways cannot be delivered vaginally.  Checking your cervix to determine: ? Whether it is thinning out (effacing). ? Whether it is opening up (dilating). ? How low your baby has moved into your birth canal.  What are the three stages of labor and delivery?  Normal labor and delivery is divided into the following three stages: Stage 1  Stage 1 is the longest stage of labor,  and it can last for hours or days. Stage 1 includes: ? Early labor. This is when contractions may be irregular, or regular and mild. Generally, early labor contractions are more than 10 minutes apart. ? Active labor. This is when contractions get longer, more regular, more frequent, and more intense. ? The transition phase. This is when contractions happen very close together, are very intense, and may last longer than during any other part of labor.  Contractions generally feel mild, infrequent, and irregular at first. They get stronger, more frequent (about every 2-3 minutes), and more regular as you progress from early labor through active labor and transition.  Many women progress through stage 1 naturally, but you may need help to continue making progress. If this happens, your health care provider may talk with you about: ? Rupturing your amniotic sac if it has not ruptured yet. ? Giving you medicine to help make your contractions stronger and more frequent.  Stage 1 ends when your cervix is completely dilated to 4 inches (10 cm) and completely effaced. This happens at the end of the transition phase. Stage 2  Once your cervix   is completely effaced and dilated to 4 inches (10 cm), you may start to feel an urge to push. It is common for the body to naturally take a rest before feeling the urge to push, especially if you received an epidural or certain other pain medicines. This rest period may last for up to 1-2 hours, depending on your unique labor experience.  During stage 2, contractions are generally less painful, because pushing helps relieve contraction pain. Instead of contraction pain, you may feel stretching and burning pain, especially when the widest part of your baby's head passes through the vaginal opening (crowning).  Your health care provider will closely monitor your pushing progress and your baby's progress through the vagina during stage 2.  Your health care provider may  massage the area of skin between your vaginal opening and anus (perineum) or apply warm compresses to your perineum. This helps it stretch as the baby's head starts to crown, which can help prevent perineal tearing. ? In some cases, an incision may be made in your perineum (episiotomy) to allow the baby to pass through the vaginal opening. An episiotomy helps to make the opening of the vagina larger to allow more room for the baby to fit through.  It is very important to breathe and focus so your health care provider can control the delivery of your baby's head. Your health care provider may have you decrease the intensity of your pushing, to help prevent perineal tearing.  After delivery of your baby's head, the shoulders and the rest of the body generally deliver very quickly and without difficulty.  Once your baby is delivered, the umbilical cord may be cut right away, or this may be delayed for 1-2 minutes, depending on your baby's health. This may vary among health care providers, hospitals, and birth centers.  If you and your baby are healthy enough, your baby may be placed on your chest or abdomen to help maintain the baby's temperature and to help you bond with each other. Some mothers and babies start breastfeeding at this time. Your health care team will dry your baby and help keep your baby warm during this time.  Your baby may need immediate care if he or she: ? Showed signs of distress during labor. ? Has a medical condition. ? Was born too early (prematurely). ? Had a bowel movement before birth (meconium). ? Shows signs of difficulty transitioning from being inside the uterus to being outside of the uterus. If you are planning to breastfeed, your health care team will help you begin a feeding. Stage 3  The third stage of labor starts immediately after the birth of your baby and ends after you deliver the placenta. The placenta is an organ that develops during pregnancy to provide  oxygen and nutrients to your baby in the womb.  Delivering the placenta may require some pushing, and you may have mild contractions. Breastfeeding can stimulate contractions to help you deliver the placenta.  After the placenta is delivered, your uterus should tighten (contract) and become firm. This helps to stop bleeding in your uterus. To help your uterus contract and to control bleeding, your health care provider may: ? Give you medicine by injection, through an IV tube, by mouth, or through your rectum (rectally). ? Massage your abdomen or perform a vaginal exam to remove any blood clots that are left in your uterus. ? Empty your bladder by placing a thin, flexible tube (catheter) into your bladder. ? Encourage you to   breastfeed your baby. After labor is over, you and your baby will be monitored closely to ensure that you are both healthy until you are ready to go home. Your health care team will teach you how to care for yourself and your baby. This information is not intended to replace advice given to you by your health care provider. Make sure you discuss any questions you have with your health care provider. Document Released: 10/01/2007 Document Revised: 07/12/2015 Document Reviewed: 01/06/2015 Elsevier Interactive Patient Education  2018 [employer] Inc[employer]  

## 2017-11-09 LAB — GC/CHLAMYDIA PROBE AMP (~~LOC~~) NOT AT ARMC
CHLAMYDIA, DNA PROBE: NEGATIVE
Neisseria Gonorrhea: NEGATIVE

## 2017-11-12 LAB — CULTURE, BETA STREP (GROUP B ONLY): STREP GP B CULTURE: NEGATIVE

## 2017-11-15 ENCOUNTER — Ambulatory Visit (INDEPENDENT_AMBULATORY_CARE_PROVIDER_SITE_OTHER): Payer: Medicaid Other | Admitting: Family Medicine

## 2017-11-15 VITALS — BP 115/65 | HR 101 | Wt 281.7 lb

## 2017-11-15 DIAGNOSIS — Z3480 Encounter for supervision of other normal pregnancy, unspecified trimester: Secondary | ICD-10-CM

## 2017-11-15 DIAGNOSIS — B354 Tinea corporis: Secondary | ICD-10-CM

## 2017-11-15 DIAGNOSIS — A6000 Herpesviral infection of urogenital system, unspecified: Secondary | ICD-10-CM

## 2017-11-15 DIAGNOSIS — O99213 Obesity complicating pregnancy, third trimester: Secondary | ICD-10-CM

## 2017-11-15 DIAGNOSIS — Z3A37 37 weeks gestation of pregnancy: Secondary | ICD-10-CM

## 2017-11-15 MED ORDER — CLOTRIMAZOLE 1 % EX CREA: 1.0000 "application " | TOPICAL_CREAM | Freq: Two times a day (BID) | CUTANEOUS | 0 refills | Status: DC

## 2017-11-15 NOTE — Progress Notes (Signed)
Pt is concerned of rash under her arms & states that she is having a lot of moisture in her vagina, no odor nor irritation.
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
## 2017-11-19 ENCOUNTER — Other Ambulatory Visit: Payer: Self-pay

## 2017-11-19 ENCOUNTER — Inpatient Hospital Stay (HOSPITAL_COMMUNITY)
Admission: AD | Admit: 2017-11-19 | Discharge: 2017-11-19 | Disposition: A | Discharge status: Home / Self Care | Payer: Medicaid Other | Source: Ambulatory Visit | Attending: Obstetrics & Gynecology | Admitting: Obstetrics & Gynecology

## 2017-11-19 ENCOUNTER — Encounter (HOSPITAL_COMMUNITY): Payer: Self-pay | Admitting: *Deleted

## 2017-11-19 DIAGNOSIS — O99210 Obesity complicating pregnancy, unspecified trimester: Secondary | ICD-10-CM

## 2017-11-19 DIAGNOSIS — Z3A38 38 weeks gestation of pregnancy: Secondary | ICD-10-CM | POA: Diagnosis not present

## 2017-11-19 DIAGNOSIS — O479 False labor, unspecified: Secondary | ICD-10-CM

## 2017-11-19 DIAGNOSIS — Z3480 Encounter for supervision of other normal pregnancy, unspecified trimester: Secondary | ICD-10-CM

## 2017-11-19 DIAGNOSIS — R109 Unspecified abdominal pain: Secondary | ICD-10-CM | POA: Diagnosis present

## 2017-11-19 DIAGNOSIS — O471 False labor at or after 37 completed weeks of gestation: Secondary | ICD-10-CM | POA: Diagnosis not present

## 2017-11-19 NOTE — MAU Note (Signed)
Pt having contractions all day but noticed they became more regular 3-5 min aprt since 1500. No vag leaking or bleeding. Good fetal movement.

## 2017-11-19 NOTE — MAU Provider Note (Signed)
S: Ms. Joy [name] is a 27 y.o. G2P1001 at 6367w3d  who presents to MAU today complaining contractions q 5-7 minutes since 1500. She denies vaginal bleeding. She denies LOF. She reports normal fetal movement.    O: BP 121/75 (BP Location: Left Arm)   Pulse 94   Temp 98.2 F (36.8 C) (Oral)   Resp 16   Ht 5\' 7"  (1.702 m)   Wt 130.6 kg   LMP 02/09/2017   BMI 45.11 kg/m  GENERAL: Well-developed, well-nourished female in no acute distress.  HEAD: Normocephalic, atraumatic.  CHEST: Normal effort of breathing, regular heart rate ABDOMEN: Soft, nontender, gravid  Cervical exam:  Dilation: (ext os 2cm int os closed) Effacement (%): 50 Cervical Position: Posterior Exam by:: K. WeissRN   Fetal Monitoring: Baseline: 120 Variability: moderate Accelerations: 15x15 Decelerations: none Contractions: 2-7   A: SIUP at 3167w3d  False labor  P: Discharge home -Labor precautions reviewed -Patient may return to MAU if condition changes or worsens  [name], [name], [place] 11/19/2017 8:31 PM

## 2017-11-19 NOTE — Discharge Instructions (Signed)
Braxton Hicks Contractions °Contractions of the uterus can occur throughout pregnancy, but they are not always a sign that you are in labor. You may have practice contractions called Braxton Hicks contractions. These false labor contractions are sometimes confused with true labor. °What are Braxton Hicks contractions? °Braxton Hicks contractions are tightening movements that occur in the muscles of the uterus before labor. Unlike true labor contractions, these contractions do not result in opening (dilation) and thinning of the cervix. Toward the end of pregnancy (32-34 weeks), Braxton Hicks contractions can happen more often and may become stronger. These contractions are sometimes difficult to tell apart from true labor because they can be very uncomfortable. You should not feel embarrassed if you go to the hospital with false labor. °Sometimes, the only way to tell if you are in true labor is for your health care provider to look for changes in the cervix. The health care provider will do a physical exam and may monitor your contractions. If you are not in true labor, the exam should show that your cervix is not dilating and your water has not broken. °If there are other health problems associated with your pregnancy, it is completely safe for you to be sent home with false labor. You may continue to have Braxton Hicks contractions until you go into true labor. °How to tell the difference between true labor and false labor °True labor °· Contractions last 30-70 seconds. °· Contractions become very regular. °· Discomfort is usually felt in the top of the uterus, and it spreads to the lower abdomen and low back. °· Contractions do not go away with walking. °· Contractions usually become more intense and increase in frequency. °· The cervix dilates and gets thinner. °False labor °· Contractions are usually shorter and not as strong as true labor contractions. °· Contractions are usually irregular. °· Contractions  are often felt in the front of the lower abdomen and in the groin. °· Contractions may go away when you walk around or change positions while lying down. °· Contractions get weaker and are shorter-lasting as time goes on. °· The cervix usually does not dilate or become thin. °Follow these instructions at home: °· Take over-the-counter and prescription medicines only as told by your health care provider. °· Keep up with your usual exercises and follow other instructions from your health care provider. °· Eat and drink lightly if you think you are going into labor. °· If Braxton Hicks contractions are making you uncomfortable: °? Change your position from lying down or resting to walking, or change from walking to resting. °? Sit and rest in a tub of warm water. °? Drink enough fluid to keep your urine pale yellow. Dehydration may cause these contractions. °? Do slow and deep breathing several times an hour. °· Keep all follow-up prenatal visits as told by your health care provider. This is important. °Contact a health care provider if: °· You have a fever. °· You have continuous pain in your abdomen. °Get help right away if: °· Your contractions become stronger, more regular, and closer together. °· You have fluid leaking or gushing from your vagina. °· You pass blood-tinged mucus (bloody show). °· You have bleeding from your vagina. °· You have low back pain that you never had before. °· You feel your baby’s head pushing down and causing pelvic pressure. °· Your baby is not moving inside you as much as it used to. °Summary °· Contractions that occur before labor are called Braxton   Hicks contractions, false labor, or practice contractions. °· Braxton Hicks contractions are usually shorter, weaker, farther apart, and less regular than true labor contractions. True labor contractions usually become progressively stronger and regular and they become more frequent. °· Manage discomfort from Braxton Hicks contractions by  changing position, resting in a warm bath, drinking plenty of water, or practicing deep breathing. °This information is not intended to replace advice given to you by your health care provider. Make sure you discuss any questions you have with your health care provider. °Document Released: 05/07/2016 Document Revised: 05/07/2016 Document Reviewed: 05/07/2016 °Elsevier Interactive Patient Education © 2018 Elsevier Inc. ° °

## 2017-11-22 ENCOUNTER — Ambulatory Visit (INDEPENDENT_AMBULATORY_CARE_PROVIDER_SITE_OTHER): Payer: Medicaid Other | Admitting: Advanced Practice Midwife

## 2017-11-22 ENCOUNTER — Encounter: Payer: Self-pay | Admitting: Advanced Practice Midwife

## 2017-11-22 VITALS — BP 123/66 | HR 97 | Wt 287.2 lb

## 2017-11-22 DIAGNOSIS — Z3480 Encounter for supervision of other normal pregnancy, unspecified trimester: Secondary | ICD-10-CM

## 2017-11-22 DIAGNOSIS — B354 Tinea corporis: Secondary | ICD-10-CM | POA: Insufficient documentation

## 2017-11-22 DIAGNOSIS — Z3A38 38 weeks gestation of pregnancy: Secondary | ICD-10-CM

## 2017-11-22 DIAGNOSIS — Z3483 Encounter for supervision of other normal pregnancy, third trimester: Secondary | ICD-10-CM
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
## 2017-11-22 NOTE — Patient Instructions (Signed)
Labor Information  Vaginal delivery means that you will give birth by pushing your baby out of your birth canal (vagina). A team of health care providers will help you before, during, and after vaginal delivery. Birth experiences are unique for every woman and every pregnancy, and birth experiences vary depending on where you choose to give birth. What should I do to prepare for my baby's birth? Before your baby is born, it is important to talk with your health care provider about:  Your labor and delivery preferences. These may include: ? Medicines that you may be given. ? How you will manage your pain. This might include non-medical pain relief techniques or injectable pain relief such as epidural analgesia. ? How you and your baby will be monitored during labor and delivery. ? Who may be in the labor and delivery room with you. ? Your feelings about surgical delivery of your baby (cesarean delivery, or C-section) if this becomes necessary. ? Your feelings about receiving donated blood through an IV tube (blood transfusion) if this becomes necessary.  Whether you are able: ? To take pictures or videos of the birth. ? To eat during labor and delivery. ? To move around, walk, or change positions during labor and delivery.  What to expect after your baby is born, such as: ? Whether delayed umbilical cord clamping and cutting is offered. ? Who will care for your baby right after birth. ? Medicines or tests that may be recommended for your baby. ? Whether breastfeeding is supported in your hospital or birth center. ? How long you will be in the hospital or birth center.  How any medical conditions you have may affect your baby or your labor and delivery experience.  To prepare for your baby's birth, you should also:  Attend all of your health care visits before delivery (prenatal visits) as recommended by your health care provider. This is important.  Prepare your home for your baby's  arrival. Make sure that you have: ? Diapers. ? Baby clothing. ? Feeding equipment. ? Safe sleeping arrangements for you and your baby.  Install a car seat in your vehicle. Have your car seat checked by a certified car seat installer to make sure that it is installed safely.  Think about who will help you with your new baby at home for at least the first several weeks after delivery.  What can I expect when I arrive at the birth center or hospital? Once you are in labor and have been admitted into the hospital or birth center, your health care provider may:  Review your pregnancy history and any concerns you have.  Insert an IV tube into one of your veins. This is used to give you fluids and medicines.  Check your blood pressure, pulse, temperature, and heart rate (vital signs).  Check whether your bag of water (amniotic sac) has broken (ruptured).  Talk with you about your birth plan and discuss pain control options.  Monitoring Your health care provider may monitor your contractions (uterine monitoring) and your baby's heart rate (fetal monitoring). You may need to be monitored:  Often, but not continuously (intermittently).  All the time or for long periods at a time (continuously). Continuous monitoring may be needed if: ? You are taking certain medicines, such as medicine to relieve pain or make your contractions stronger. ? You have pregnancy or labor complications.  Monitoring may be done by:  Placing a special stethoscope or a handheld monitoring device on your abdomen  check your baby's heartbeat, and feeling your abdomen for contractions. This method of monitoring does not continuously record your baby's heartbeat or your contractions.  Placing monitors on your abdomen (external monitors) to record your baby's heartbeat and the frequency and length of contractions. You may not have to wear external monitors all the time.  Placing monitors inside of your uterus  (internal monitors) to record your baby's heartbeat and the frequency, length, and strength of your contractions. ? Your health care provider may use internal monitors if he or she needs more information about the strength of your contractions or your baby's heart rate. ? Internal monitors are put in place by passing a thin, flexible wire through your vagina and into your uterus. Depending on the type of monitor, it may remain in your uterus or on your baby's head until birth. ? Your health care provider will discuss the benefits and risks of internal monitoring with you and will ask for your permission before inserting the monitors.  Telemetry. This is a type of continuous monitoring that can be done with external or internal monitors. Instead of having to stay in bed, you are able to move around during telemetry. Ask your health care provider if telemetry is an option for you.  Physical exam Your health care provider may perform a physical exam. This may include:  Checking whether your baby is positioned: ? With the head toward your vagina (head-down). This is most common. ? With the head toward the top of your uterus (head-up or breech). If your baby is in a breech position, your health care provider may try to turn your baby to a head-down position so you can deliver vaginally. If it does not seem that your baby can be born vaginally, your provider may recommend surgery to deliver your baby. In rare cases, you may be able to deliver vaginally if your baby is head-up (breech delivery). ? Lying sideways (transverse). Babies that are lying sideways cannot be delivered vaginally.  Checking your cervix to determine: ? Whether it is thinning out (effacing). ? Whether it is opening up (dilating). ? How low your baby has moved into your birth canal.  What are the three stages of labor and delivery?  Normal labor and delivery is divided into the following three stages: Stage 1  Stage 1 is the  longest stage of labor, and it can last for hours or days. Stage 1 includes: ? Early labor. This is when contractions may be irregular, or regular and mild. Generally, early labor contractions are more than 10 minutes apart. ? Active labor. This is when contractions get longer, more regular, more frequent, and more intense. ? The transition phase. This is when contractions happen very close together, are very intense, and may last longer than during any other part of labor.  Contractions generally feel mild, infrequent, and irregular at first. They get stronger, more frequent (about every 2-3 minutes), and more regular as you progress from early labor through active labor and transition.  Many women progress through stage 1 naturally, but you may need help to continue making progress. If this happens, your health care provider may talk with you about: ? Rupturing your amniotic sac if it has not ruptured yet. ? Giving you medicine to help make your contractions stronger and more frequent.  Stage 1 ends when your cervix is completely dilated to 4 inches (10 cm) and completely effaced. This happens at the end of the transition phase. Stage 2  Once   your cervix is completely effaced and dilated to 4 inches (10 cm), you may start to feel an urge to push. It is common for the body to naturally take a rest before feeling the urge to push, especially if you received an epidural or certain other pain medicines. This rest period may last for up to 1-2 hours, depending on your unique labor experience.  During stage 2, contractions are generally less painful, because pushing helps relieve contraction pain. Instead of contraction pain, you may feel stretching and burning pain, especially when the widest part of your baby's head passes through the vaginal opening (crowning).  Your health care provider will closely monitor your pushing progress and your baby's progress through the vagina during stage 2.  Your  health care provider may massage the area of skin between your vaginal opening and anus (perineum) or apply warm compresses to your perineum. This helps it stretch as the baby's head starts to crown, which can help prevent perineal tearing. ? In some cases, an incision may be made in your perineum (episiotomy) to allow the baby to pass through the vaginal opening. An episiotomy helps to make the opening of the vagina larger to allow more room for the baby to fit through.  It is very important to breathe and focus so your health care provider can control the delivery of your baby's head. Your health care provider may have you decrease the intensity of your pushing, to help prevent perineal tearing.  After delivery of your baby's head, the shoulders and the rest of the body generally deliver very quickly and without difficulty.  Once your baby is delivered, the umbilical cord may be cut right away, or this may be delayed for 1-2 minutes, depending on your baby's health. This may vary among health care providers, hospitals, and birth centers.  If you and your baby are healthy enough, your baby may be placed on your chest or abdomen to help maintain the baby's temperature and to help you bond with each other. Some mothers and babies start breastfeeding at this time. Your health care team will dry your baby and help keep your baby warm during this time.  Your baby may need immediate care if he or she: ? Showed signs of distress during labor. ? Has a medical condition. ? Was born too early (prematurely). ? Had a bowel movement before birth (meconium). ? Shows signs of difficulty transitioning from being inside the uterus to being outside of the uterus. If you are planning to breastfeed, your health care team will help you begin a feeding. Stage 3  The third stage of labor starts immediately after the birth of your baby and ends after you deliver the placenta. The placenta is an organ that develops  during pregnancy to provide oxygen and nutrients to your baby in the womb.  Delivering the placenta may require some pushing, and you may have mild contractions. Breastfeeding can stimulate contractions to help you deliver the placenta.  After the placenta is delivered, your uterus should tighten (contract) and become firm. This helps to stop bleeding in your uterus. To help your uterus contract and to control bleeding, your health care provider may: ? Give you medicine by injection, through an IV tube, by mouth, or through your rectum (rectally). ? Massage your abdomen or perform a vaginal exam to remove any blood clots that are left in your uterus. ? Empty your bladder by placing a thin, flexible tube (catheter) into your bladder. ? Encourage   Encourage you to breastfeed your baby. After labor is over, you and your baby will be monitored closely to ensure that you are both healthy until you are ready to go home. Your health care team will teach you how to care for yourself and your baby. This information is not intended to replace advice given to you by your health care provider. Make sure you discuss any questions you have with your health care provider. Document Released: 10/01/2007 Document Revised: 07/12/2015 Document Reviewed: 01/06/2015 Elsevier Interactive Patient Education  2018 [employer]

## 2017-11-24 ENCOUNTER — Other Ambulatory Visit: Payer: Self-pay

## 2017-11-24 ENCOUNTER — Encounter (HOSPITAL_COMMUNITY): Payer: Self-pay | Admitting: *Deleted

## 2017-11-24 ENCOUNTER — Inpatient Hospital Stay (HOSPITAL_COMMUNITY)
Admission: AD | Admit: 2017-11-24 | Discharge: 2017-11-24 | Disposition: A | Discharge status: Home / Self Care | Payer: Medicaid Other | Source: Ambulatory Visit | Attending: Obstetrics & Gynecology | Admitting: Obstetrics & Gynecology

## 2017-11-24 DIAGNOSIS — Z3689 Encounter for other specified antenatal screening: Secondary | ICD-10-CM | POA: Diagnosis not present

## 2017-11-24 DIAGNOSIS — Z87891 Personal history of nicotine dependence: Secondary | ICD-10-CM | POA: Diagnosis not present

## 2017-11-24 DIAGNOSIS — N898 Other specified noninflammatory disorders of vagina: Secondary | ICD-10-CM | POA: Diagnosis not present

## 2017-11-24 DIAGNOSIS — O99210 Obesity complicating pregnancy, unspecified trimester: Secondary | ICD-10-CM | POA: Diagnosis not present

## 2017-11-24 DIAGNOSIS — Z3480 Encounter for supervision of other normal pregnancy, unspecified trimester: Secondary | ICD-10-CM

## 2017-11-24 DIAGNOSIS — Z3A39 39 weeks gestation of pregnancy: Secondary | ICD-10-CM | POA: Insufficient documentation

## 2017-11-24 DIAGNOSIS — O26893 Other specified pregnancy related conditions, third trimester: Secondary | ICD-10-CM | POA: Diagnosis present

## 2017-11-24 LAB — POCT FERN TEST: POCT Fern Test: NEGATIVE

## 2017-11-24 LAB — AMNISURE RUPTURE OF MEMBRANE (ROM) NOT AT ARMC: AMNISURE: NEGATIVE

## 2017-11-24 NOTE — MAU Provider Note (Signed)
History   [id number]   Chief Complaint  Patient presents with  . Rupture of Membranes    HPI Joy [name] is a 27 y.o. female  G2P1001 @39 .1 wks here with report of gush of clear fluid x2 around 1300.  Leaking of fluid has not continued but she feels wet. Pt reports rare contractions. She denies vaginal bleeding. She reports good fetal movement. All other systems negative.    Patient's last menstrual period was 02/09/2017.  OB History  Gravida Para Term Preterm AB Living  2 1 1     1   SAB TAB Ectopic Multiple Live Births        0 1    # Outcome Date GA Lbr Len/2nd Weight Sex Delivery Anes PTL Lv  2 Current           1 Term 07/27/15 [redacted]w[redacted]d 13:15 / 00:15 2920 g F Vag-Spont EPI  LIV     Birth Comments: dynamic cervix    Past Medical History:  Diagnosis Date  . BV (bacterial vaginosis)   . Genital herpes     Family History  Problem Relation Age of Onset  . Healthy Mother   . Healthy Father     Social History   Socioeconomic History  . Marital status: Single    Spouse name: Not on file  . Number of children: Not on file  . Years of education: Not on file  . Highest education level: Not on file  Occupational History  . Not on file  Social Needs  . Financial resource strain: Not on file  . Food insecurity:    Worry: Not on file    Inability: Not on file  . Transportation needs:    Medical: Not on file    Non-medical: Not on file  Tobacco Use  . Smoking status: Former Smoker    Last attempt to quit: 11/19/2013    Years since quitting: 4.0  . Smokeless tobacco: Never Used  Substance and Sexual Activity  . Alcohol use: Not Currently    Comment: quit with positive HPT (03/22/2017)  . Drug use: Yes    Types: Marijuana    Comment: pt states she quit with this positive HPT  . Sexual activity: Yes    Birth control/protection: None  Lifestyle  . Physical activity:    Days per week: Not on file    Minutes per session: Not on file  . Stress: Not on file   Relationships  . Social connections:    Talks on phone: Not on file    Gets together: Not on file    Attends religious service: Not on file    Active member of club or organization: Not on file    Attends meetings of clubs or organizations: Not on file    Relationship status: Not on file  Other Topics Concern  . Not on file  Social History Narrative  . Not on file    No Known Allergies  No current facility-administered medications on file prior to encounter.    Current Outpatient Medications on File Prior to Encounter  Medication Sig Dispense Refill  . clotrimazole (LOTRIMIN) 1 % cream Apply 1 application topically 2 (two) times daily. 60 g 0  . triamcinolone (KENALOG) 0.025 % ointment Apply 1 application topically 2 (two) times daily. 80 g 1  . valACYclovir (VALTREX) 500 MG tablet Take 1 tablet (500 mg total) by mouth 2 (two) times daily. 60 tablet 6     Review of  Systems  Gastrointestinal: Negative for abdominal pain.  Genitourinary: Positive for vaginal discharge. Negative for vaginal bleeding.     Physical Exam   Vitals:   11/24/17 1359  BP: 127/76  Pulse: 95  Resp: 20  Temp: 97.8 F (36.6 C)  TempSrc: Oral  SpO2: 100%  Weight: 131.3 kg  Height: 5\' 7"  (1.702 m)    Physical Exam  Constitutional: She is oriented to person, place, and time. She appears well-developed and well-nourished. No distress.  HENT:  Head: Normocephalic and atraumatic.  Neck: Normal range of motion.  Cardiovascular: Normal rate.  Respiratory: Effort normal. No respiratory distress.  Genitourinary:  Genitourinary Comments: SSE: no pool, +thin white discharge, fern neg  Musculoskeletal: Normal range of motion.  Neurological: She is alert and oriented to person, place, and time.  Skin: Skin is warm and dry.  Psychiatric: She has a normal mood and affect.  EFM: 135 bpm, mod variability, + accels, no decels Toco: rare  Results for orders placed or performed during the hospital  encounter of 11/24/17 (from the past 24 hour(s))  POCT fern test     Status: None   Collection Time: 11/24/17  3:19 PM  Result Value Ref Range   POCT Fern Test Negative = intact amniotic membranes   [facility] (rom[facility]     Status: None   Collection Time: 11/24/17  3:44 PM  Result Value Ref Range   Amnisure ROM NEGATIVE     MAU Course  Procedures  MDM Labs ordered and reviewed. No evidence of SROM. Pt adamant this occurred with her last pregnancy and requests another test. Amnisure ordered and negative. Stable for discharge home.  Assessment and Plan  [redacted] weeks gestation NST reactive Vaginal discharge in pregnancy Discharge home Follow up in OB office as scheduled Labor precautions  Allergies as of 11/24/2017   No Known Allergies     Medication List    STOP taking these medications   clotrimazole 1 % cream Commonly known as:  LOTRIMIN     TAKE these medications   triamcinolone 0.025 % ointment Commonly known as:  KENALOG Apply 1 application topically 2 (two) times daily.   valACYclovir 500 MG tablet Commonly known as:  VALTREX Take 1 tablet (500 mg total) by mouth 2 (two) times daily.      Donette LarryBhambri, Karsten Howry, CNM 11/24/2017 3:01 PM

## 2017-11-24 NOTE — MAU Note (Signed)
Presents with c/o LOF since 1310 this afternoon, clear fluid.  Denies VB.  Reports +FM.

## 2017-11-24 NOTE — Discharge Instructions (Signed)
Braxton Hicks Contractions °Contractions of the uterus can occur throughout pregnancy, but they are not always a sign that you are in labor. You may have practice contractions called Braxton Hicks contractions. These false labor contractions are sometimes confused with true labor. °What are Braxton Hicks contractions? °Braxton Hicks contractions are tightening movements that occur in the muscles of the uterus before labor. Unlike true labor contractions, these contractions do not result in opening (dilation) and thinning of the cervix. Toward the end of pregnancy (32-34 weeks), Braxton Hicks contractions can happen more often and may become stronger. These contractions are sometimes difficult to tell apart from true labor because they can be very uncomfortable. You should not feel embarrassed if you go to the hospital with false labor. °Sometimes, the only way to tell if you are in true labor is for your health care provider to look for changes in the cervix. The health care provider will do a physical exam and may monitor your contractions. If you are not in true labor, the exam should show that your cervix is not dilating and your water has not broken. °If there are other health problems associated with your pregnancy, it is completely safe for you to be sent home with false labor. You may continue to have Braxton Hicks contractions until you go into true labor. °How to tell the difference between true labor and false labor °True labor °· Contractions last 30-70 seconds. °· Contractions become very regular. °· Discomfort is usually felt in the top of the uterus, and it spreads to the lower abdomen and low back. °· Contractions do not go away with walking. °· Contractions usually become more intense and increase in frequency. °· The cervix dilates and gets thinner. °False labor °· Contractions are usually shorter and not as strong as true labor contractions. °· Contractions are usually irregular. °· Contractions  are often felt in the front of the lower abdomen and in the groin. °· Contractions may go away when you walk around or change positions while lying down. °· Contractions get weaker and are shorter-lasting as time goes on. °· The cervix usually does not dilate or become thin. °Follow these instructions at home: °· Take over-the-counter and prescription medicines only as told by your health care provider. °· Keep up with your usual exercises and follow other instructions from your health care provider. °· Eat and drink lightly if you think you are going into labor. °· If Braxton Hicks contractions are making you uncomfortable: °? Change your position from lying down or resting to walking, or change from walking to resting. °? Sit and rest in a tub of warm water. °? Drink enough fluid to keep your urine pale yellow. Dehydration may cause these contractions. °? Do slow and deep breathing several times an hour. °· Keep all follow-up prenatal visits as told by your health care provider. This is important. °Contact a health care provider if: °· You have a fever. °· You have continuous pain in your abdomen. °Get help right away if: °· Your contractions become stronger, more regular, and closer together. °· You have fluid leaking or gushing from your vagina. °· You pass blood-tinged mucus (bloody show). °· You have bleeding from your vagina. °· You have low back pain that you never had before. °· You feel your baby’s head pushing down and causing pelvic pressure. °· Your baby is not moving inside you as much as it used to. °Summary °· Contractions that occur before labor are called Braxton   Hicks contractions, false labor, or practice contractions. °· Braxton Hicks contractions are usually shorter, weaker, farther apart, and less regular than true labor contractions. True labor contractions usually become progressively stronger and regular and they become more frequent. °· Manage discomfort from Braxton Hicks contractions by  changing position, resting in a warm bath, drinking plenty of water, or practicing deep breathing. °This information is not intended to replace advice given to you by your health care provider. Make sure you discuss any questions you have with your health care provider. °Document Released: 05/07/2016 Document Revised: 05/07/2016 Document Reviewed: 05/07/2016 °Elsevier Interactive Patient Education © 2018 Elsevier Inc. ° °

## 2017-11-24 NOTE — Progress Notes (Signed)
Pt given po fluids & crackers to assess ability to keep food & fluids down

## 2017-11-24 NOTE — MAU Note (Signed)
Pt informed of negative fern test results, is very upset, states she is sure her water is broken.  Fabian NovemberM. Bhambri CNM informed, amnisure ordered.

## 2017-11-29 ENCOUNTER — Ambulatory Visit (INDEPENDENT_AMBULATORY_CARE_PROVIDER_SITE_OTHER): Payer: Medicaid Other | Admitting: Obstetrics & Gynecology

## 2017-11-29 VITALS — BP 124/68 | HR 90 | Wt 288.4 lb

## 2017-11-29 DIAGNOSIS — Z3480 Encounter for supervision of other normal pregnancy, unspecified trimester: Secondary | ICD-10-CM

## 2017-11-29 DIAGNOSIS — Z3A39 39 weeks gestation of pregnancy: Secondary | ICD-10-CM

## 2017-11-29 DIAGNOSIS — Z3483 Encounter for supervision of other normal pregnancy, third trimester: Secondary | ICD-10-CM
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
## 2017-11-29 NOTE — Addendum Note (Signed)
Addended by: Jaynie CollinsANYANWU, Nathyn Luiz A on: 11/29/2017 11:21 AM   Modules accepted: Orders, SmartSet

## 2017-11-29 NOTE — Progress Notes (Signed)
Induction scheduled for 12/3 @ 07:30. Pt made aware.

## 2017-11-30 ENCOUNTER — Encounter (HOSPITAL_COMMUNITY): Payer: Self-pay | Admitting: *Deleted

## 2017-11-30 ENCOUNTER — Telehealth (HOSPITAL_COMMUNITY): Payer: Self-pay | Admitting: *Deleted

## 2017-11-30 NOTE — Telephone Encounter (Signed)
Preadmission screen  

## 2017-12-03 ENCOUNTER — Inpatient Hospital Stay (HOSPITAL_COMMUNITY)
Admission: AD | Admit: 2017-12-03 | Discharge: 2017-12-04 | Disposition: A | Discharge status: Home / Self Care | Payer: Medicaid Other | Source: Ambulatory Visit | Attending: Obstetrics & Gynecology | Admitting: Obstetrics & Gynecology

## 2017-12-03 ENCOUNTER — Encounter (HOSPITAL_COMMUNITY): Payer: Self-pay | Admitting: *Deleted

## 2017-12-03 DIAGNOSIS — Z3480 Encounter for supervision of other normal pregnancy, unspecified trimester: Secondary | ICD-10-CM

## 2017-12-03 DIAGNOSIS — O471 False labor at or after 37 completed weeks of gestation: Secondary | ICD-10-CM | POA: Insufficient documentation

## 2017-12-03 DIAGNOSIS — O99210 Obesity complicating pregnancy, unspecified trimester: Secondary | ICD-10-CM

## 2017-12-03 DIAGNOSIS — O479 False labor, unspecified: Secondary | ICD-10-CM

## 2017-12-03 DIAGNOSIS — Z3A40 40 weeks gestation of pregnancy: Secondary | ICD-10-CM

## 2017-12-03 NOTE — MAU Note (Signed)
Had ctxs earlier today but they stopped. Ctxs now for last 2 hours. Denies vag bleeding or LOF. Some mucousy d/c

## 2017-12-04 DIAGNOSIS — O471 False labor at or after 37 completed weeks of gestation: Secondary | ICD-10-CM | POA: Diagnosis not present

## 2017-12-04 DIAGNOSIS — Z3A40 40 weeks gestation of pregnancy: Secondary | ICD-10-CM | POA: Diagnosis not present

## 2017-12-04 NOTE — MAU Provider Note (Signed)
RN Labor Eval  Polly Cobiarin Archibald is a 27yo G2P1001 at 433w4d who presents from home with contractions. Pregnancy has been relatively uncomplicated. Contractions overnight off/on but have stopped since arriving to MAU. RN reports currently eating, comfortable. Denies vaginal bleeding, leakage of fluids. SVE unchanged from clinic 5 days ago - 3/80/-3. Reactive NST 120/mod/+a/-d, uterine irritability. Counseled on return labor precautions. Has PDIOL scheduled next week.   Cristal DeerLaurel S. Earlene PlaterWallace, DO OB/GYN Fellow

## 2017-12-06 ENCOUNTER — Other Ambulatory Visit: Payer: Medicaid Other

## 2017-12-06 ENCOUNTER — Other Ambulatory Visit: Payer: Self-pay

## 2017-12-06 ENCOUNTER — Inpatient Hospital Stay (HOSPITAL_COMMUNITY): Payer: Medicaid Other | Admitting: Anesthesiology

## 2017-12-06 ENCOUNTER — Inpatient Hospital Stay (HOSPITAL_COMMUNITY)
Admission: AD | Admit: 2017-12-06 | Discharge: 2017-12-08 | DRG: 768 | Disposition: A | Discharge status: Home / Self Care | Payer: Medicaid Other | Attending: Obstetrics and Gynecology | Admitting: Obstetrics and Gynecology

## 2017-12-06 ENCOUNTER — Encounter (HOSPITAL_COMMUNITY): Payer: Self-pay | Admitting: *Deleted

## 2017-12-06 ENCOUNTER — Encounter: Payer: Medicaid Other | Admitting: Advanced Practice Midwife

## 2017-12-06 DIAGNOSIS — Z3A40 40 weeks gestation of pregnancy: Secondary | ICD-10-CM

## 2017-12-06 DIAGNOSIS — Z87891 Personal history of nicotine dependence: Secondary | ICD-10-CM

## 2017-12-06 DIAGNOSIS — Z3A41 41 weeks gestation of pregnancy: Secondary | ICD-10-CM

## 2017-12-06 DIAGNOSIS — O480 Post-term pregnancy: Secondary | ICD-10-CM

## 2017-12-06 DIAGNOSIS — O9832 Other infections with a predominantly sexual mode of transmission complicating childbirth: Principal | ICD-10-CM | POA: Diagnosis present

## 2017-12-06 DIAGNOSIS — O4292 Full-term premature rupture of membranes, unspecified as to length of time between rupture and onset of labor: Secondary | ICD-10-CM | POA: Diagnosis present

## 2017-12-06 DIAGNOSIS — O4202 Full-term premature rupture of membranes, onset of labor within 24 hours of rupture: Secondary | ICD-10-CM

## 2017-12-06 DIAGNOSIS — A6000 Herpesviral infection of urogenital system, unspecified: Secondary | ICD-10-CM | POA: Diagnosis present

## 2017-12-06 DIAGNOSIS — O99214 Obesity complicating childbirth: Secondary | ICD-10-CM | POA: Diagnosis present

## 2017-12-06 DIAGNOSIS — Z3480 Encounter for supervision of other normal pregnancy, unspecified trimester: Secondary | ICD-10-CM

## 2017-12-06 LAB — TYPE AND SCREEN
ABO/RH(D): O POS
ANTIBODY SCREEN: NEGATIVE

## 2017-12-06 LAB — CBC
HCT: 34.5 % — ABNORMAL LOW (ref 36.0–46.0)
HEMOGLOBIN: 11.8 g/dL — AB (ref 12.0–15.0)
MCH: 30.3 pg (ref 26.0–34.0)
MCHC: 34.2 g/dL (ref 30.0–36.0)
MCV: 88.5 fL (ref 80.0–100.0)
Platelets: 268 10*3/uL (ref 150–400)
RBC: 3.9 MIL/uL (ref 3.87–5.11)
RDW: 14.1 % (ref 11.5–15.5)
WBC: 9.9 10*3/uL (ref 4.0–10.5)

## 2017-12-06 LAB — RPR: RPR Ser Ql: NONREACTIVE

## 2017-12-06 MED ORDER — DIPHENHYDRAMINE HCL 50 MG/ML IJ SOLN: 12.5000 mg | INTRAMUSCULAR | Status: DC | PRN

## 2017-12-06 MED ORDER — DIBUCAINE 1 % RE OINT: 1.0000 "application " | TOPICAL_OINTMENT | RECTAL | Status: DC | PRN

## 2017-12-06 MED ORDER — ZOLPIDEM TARTRATE 5 MG PO TABS: 5.0000 mg | ORAL_TABLET | Freq: Every evening | ORAL | Status: DC | PRN

## 2017-12-06 MED ORDER — LACTATED RINGERS IV SOLN: 500.0000 mL | INTRAVENOUS | Status: DC | PRN

## 2017-12-06 MED ORDER — ACETAMINOPHEN 325 MG PO TABS
650.0000 mg | ORAL_TABLET | ORAL | Status: DC | PRN
  Administered 2017-12-07 (×2): 650 mg via ORAL
  Filled 2017-12-06 (×2): qty 2

## 2017-12-06 MED ORDER — PRENATAL MULTIVITAMIN CH
1.0000 | ORAL_TABLET | Freq: Every day | ORAL | Status: DC
  Administered 2017-12-06 – 2017-12-08 (×3): 1 via ORAL
  Filled 2017-12-06 (×3): qty 1

## 2017-12-06 MED ORDER — PHENYLEPHRINE 40 MCG/ML (10ML) SYRINGE FOR IV PUSH (FOR BLOOD PRESSURE SUPPORT)
80.0000 ug | PREFILLED_SYRINGE | INTRAVENOUS | Status: DC | PRN
  Filled 2017-12-06: qty 5

## 2017-12-06 MED ORDER — WITCH HAZEL-GLYCERIN EX PADS: 1.0000 "application " | MEDICATED_PAD | CUTANEOUS | Status: DC | PRN

## 2017-12-06 MED ORDER — EPHEDRINE 5 MG/ML INJ
10.0000 mg | INTRAVENOUS | Status: DC | PRN
  Filled 2017-12-06: qty 2

## 2017-12-06 MED ORDER — FLEET ENEMA 7-19 GM/118ML RE ENEM: 1.0000 | ENEMA | Freq: Every day | RECTAL | Status: DC | PRN

## 2017-12-06 MED ORDER — LACTATED RINGERS IV SOLN
INTRAVENOUS | Status: DC
  Administered 2017-12-06: 02:00:00 via INTRAVENOUS

## 2017-12-06 MED ORDER — ONDANSETRON HCL 4 MG/2ML IJ SOLN: 4.0000 mg | Freq: Four times a day (QID) | INTRAMUSCULAR | Status: DC | PRN

## 2017-12-06 MED ORDER — ACETAMINOPHEN 325 MG PO TABS: 650.0000 mg | ORAL_TABLET | ORAL | Status: DC | PRN

## 2017-12-06 MED ORDER — DIPHENHYDRAMINE HCL 25 MG PO CAPS: 25.0000 mg | ORAL_CAPSULE | Freq: Four times a day (QID) | ORAL | Status: DC | PRN

## 2017-12-06 MED ORDER — DEXTROSE 5 % IV SOLN
3.0000 g | Freq: Once | INTRAVENOUS | Status: AC
  Administered 2017-12-06: 3 g via INTRAVENOUS
  Filled 2017-12-06: qty 3

## 2017-12-06 MED ORDER — IBUPROFEN 600 MG PO TABS
600.0000 mg | ORAL_TABLET | Freq: Four times a day (QID) | ORAL | Status: DC
  Administered 2017-12-06 – 2017-12-08 (×9): 600 mg via ORAL
  Filled 2017-12-06 (×9): qty 1

## 2017-12-06 MED ORDER — MISOPROSTOL 50MCG HALF TABLET
50.0000 ug | ORAL_TABLET | ORAL | Status: DC | PRN
  Filled 2017-12-06: qty 1

## 2017-12-06 MED ORDER — OXYTOCIN 40 UNITS IN LACTATED RINGERS INFUSION - SIMPLE MED: 1.0000 m[IU]/min | INTRAVENOUS | Status: DC

## 2017-12-06 MED ORDER — HYDROXYZINE HCL 50 MG PO TABS
50.0000 mg | ORAL_TABLET | Freq: Four times a day (QID) | ORAL | Status: DC | PRN
  Filled 2017-12-06: qty 1

## 2017-12-06 MED ORDER — LIDOCAINE HCL (PF) 1 % IJ SOLN
INTRAMUSCULAR | Status: DC | PRN
  Administered 2017-12-06 (×2): 5 mL via EPIDURAL

## 2017-12-06 MED ORDER — TERBUTALINE SULFATE 1 MG/ML IJ SOLN
0.2500 mg | Freq: Once | INTRAMUSCULAR | Status: DC | PRN
  Filled 2017-12-06: qty 1

## 2017-12-06 MED ORDER — MISOPROSTOL 25 MCG QUARTER TABLET
25.0000 ug | ORAL_TABLET | ORAL | Status: DC | PRN
  Filled 2017-12-06: qty 1

## 2017-12-06 MED ORDER — ONDANSETRON HCL 4 MG/2ML IJ SOLN: 4.0000 mg | INTRAMUSCULAR | Status: DC | PRN

## 2017-12-06 MED ORDER — OXYCODONE HCL 5 MG PO TABS: 5.0000 mg | ORAL_TABLET | ORAL | Status: DC | PRN

## 2017-12-06 MED ORDER — OXYCODONE HCL 5 MG PO TABS
10.0000 mg | ORAL_TABLET | ORAL | Status: DC | PRN
  Administered 2017-12-06: 10 mg via ORAL
  Filled 2017-12-06: qty 2

## 2017-12-06 MED ORDER — OXYTOCIN 40 UNITS IN LACTATED RINGERS INFUSION - SIMPLE MED
2.5000 [IU]/h | INTRAVENOUS | Status: DC
  Administered 2017-12-06: 2.5 [IU]/h via INTRAVENOUS
  Filled 2017-12-06: qty 1000

## 2017-12-06 MED ORDER — POLYETHYLENE GLYCOL 3350 17 G PO PACK
17.0000 g | PACK | Freq: Every day | ORAL | Status: DC
  Administered 2017-12-07: 17 g via ORAL
  Filled 2017-12-06 (×3): qty 1

## 2017-12-06 MED ORDER — FENTANYL 2.5 MCG/ML BUPIVACAINE 1/10 % EPIDURAL INFUSION (WH - ANES)
14.0000 mL/h | INTRAMUSCULAR | Status: DC | PRN
  Administered 2017-12-06: 14 mL/h via EPIDURAL
  Filled 2017-12-06: qty 100

## 2017-12-06 MED ORDER — OXYCODONE-ACETAMINOPHEN 5-325 MG PO TABS: 1.0000 | ORAL_TABLET | ORAL | Status: DC | PRN

## 2017-12-06 MED ORDER — OXYTOCIN BOLUS FROM INFUSION
500.0000 mL | Freq: Once | INTRAVENOUS | Status: AC
  Administered 2017-12-06: 500 mL via INTRAVENOUS

## 2017-12-06 MED ORDER — LIDOCAINE HCL (PF) 1 % IJ SOLN
30.0000 mL | INTRAMUSCULAR | Status: DC | PRN
  Filled 2017-12-06: qty 30

## 2017-12-06 MED ORDER — SOD CITRATE-CITRIC ACID 500-334 MG/5ML PO SOLN: 30.0000 mL | ORAL | Status: DC | PRN

## 2017-12-06 MED ORDER — COCONUT OIL OIL: 1.0000 "application " | TOPICAL_OIL | Status: DC | PRN

## 2017-12-06 MED ORDER — BENZOCAINE-MENTHOL 20-0.5 % EX AERO
1.0000 "application " | INHALATION_SPRAY | CUTANEOUS | Status: DC | PRN
  Administered 2017-12-06: 1 via TOPICAL
  Filled 2017-12-06: qty 56

## 2017-12-06 MED ORDER — ONDANSETRON HCL 4 MG PO TABS: 4.0000 mg | ORAL_TABLET | ORAL | Status: DC | PRN

## 2017-12-06 MED ORDER — SENNOSIDES-DOCUSATE SODIUM 8.6-50 MG PO TABS
2.0000 | ORAL_TABLET | ORAL | Status: DC
  Administered 2017-12-07 (×2): 2 via ORAL
  Filled 2017-12-06 (×2): qty 2

## 2017-12-06 MED ORDER — SIMETHICONE 80 MG PO CHEW: 80.0000 mg | CHEWABLE_TABLET | ORAL | Status: DC | PRN

## 2017-12-06 MED ORDER — OXYCODONE-ACETAMINOPHEN 5-325 MG PO TABS: 2.0000 | ORAL_TABLET | ORAL | Status: DC | PRN

## 2017-12-06 MED ORDER — TETANUS-DIPHTH-ACELL PERTUSSIS 5-2.5-18.5 LF-MCG/0.5 IM SUSP: 0.5000 mL | Freq: Once | INTRAMUSCULAR | Status: DC

## 2017-12-06 MED ORDER — LACTATED RINGERS IV SOLN
500.0000 mL | Freq: Once | INTRAVENOUS | Status: AC
  Administered 2017-12-06: 500 mL via INTRAVENOUS

## 2017-12-06 MED ORDER — PHENYLEPHRINE 40 MCG/ML (10ML) SYRINGE FOR IV PUSH (FOR BLOOD PRESSURE SUPPORT)
80.0000 ug | PREFILLED_SYRINGE | INTRAVENOUS | Status: DC | PRN
  Filled 2017-12-06: qty 10
  Filled 2017-12-06: qty 5

## 2017-12-06 MED ORDER — FENTANYL CITRATE (PF) 100 MCG/2ML IJ SOLN
50.0000 ug | INTRAMUSCULAR | Status: DC | PRN
  Administered 2017-12-06: 100 ug via INTRAVENOUS
  Filled 2017-12-06: qty 2

## 2017-12-06 NOTE — Lactation Note (Signed)
This note was copied from a baby's chart. Lactation Consultation Note  Patient Name: Joy [name]: 12/06/2017 Reason for consult: Initial assessment;Term;Difficult latch P2, 11 hour female infant. Per mom, infant had one stool and no voids in past 11 hours. Per mom, she attempt to BF at 6 pm but infant was to sleepy and had emesis. Per mom, she BF her [age] year old for 9 months. [facility]  LC entered room infant in basinet cuing to breastfeed. Prior to latching infant to breast mom demonstrated hand expression and infant was given 2 ml of EBM on spoon. Explain to mom how  to wake sleepy baby at breast to nurse and do STS for feedings. Mom latched infant on left breast using cross cradle hold, reinforced importance of nose to breast while breastfeeding, infant had wide mouth gape when latched and swallows heard by LC. Mom has large breast and nipples but infant can open mouth wide and latch without difficulty.  Infant was breastfeeding for 18 minutes and still BF as LC left room. Mom verbalized that she will BF according to hunger cues, 8 to 12 times within 24 hours including nights and not exceed 3 hours without BF infant. BF plans: 1. Mom will BF according hunger cues and not exceed 3 hours without BF infant. 2. Mom will bring infant close to breast with nose touching breast for deeper latch. 3. Mom will do STS as much as possible. 4. Mom will call Nurse or LC if she has any questions, concerns or needs assistance with breastfeeding.    Maternal Data Formula Feeding for Exclusion: No Has patient been taught Hand Expression?: Yes(Mom expressed 2 ml of colostrum that was spoon feed to infant.) Does the patient have breastfeeding experience prior to this delivery?: Yes  Feeding Feeding Type: Breast Fed  LATCH Score Latch: Grasps breast easily, tongue down, lips flanged, rhythmical sucking.  Audible Swallowing: Spontaneous and  intermittent  Type of Nipple: Everted at rest and after stimulation  Comfort (Breast/Nipple): Soft / non-tender  Hold (Positioning): Assistance needed to correctly position infant at breast and maintain latch.  LATCH Score: 9  Interventions Interventions: Breast feeding basics reviewed;Assisted with latch;Breast compression;Adjust position;Skin to skin;Support pillows;Hand express  Lactation Tools Discussed/Used WIC Program: No([place]   Consult Status Consult Status: Follow-up [name]: 12/07/17 Follow-up type: In-patient    Joy [name] 12/06/2017, 8:55 PM

## 2017-12-06 NOTE — Significant Event (Signed)
Pt admitted to Dr. Macon LargeAnyanwu about her recent HSV outbreak appx 2 weeks ago, Dr. Macon LargeAnyanwu informed her about the concern for the fetus at time of delivery if pt continues with plan of vaginal delivery, pt was offered  C/section by Dr. Macon LargeAnyanwu pt refuses , pt verbalized understanding and states  " I will take my Chances". Pt was also informed that the pediatricians will also be notified. Pt agrees and verbalized understanding.

## 2017-12-06 NOTE — Anesthesia Pain Management Evaluation Note (Signed)
  CRNA Pain Management Visit Note  Patient: Joy [name], [age] y.o., female  "Hello [facility] am a [facility] the [facility]. We have an anesthesia team available at all times to provide care throughout the hospital, including epidural management and anesthesia for C-section. I don't know your plan for the delivery whether it a natural birth, water birth, IV sedation, nitrous supplementation, doula or epidural, but we want to meet your pain goals."   1.Was your pain managed to your expectations on prior hospitalizations?   Yes   2.What is your expectation for pain management during this hospitalization?     Epidural  3.How can we help you reach that goal? Epidural in place. Bolus given from pump for some discomfort with contractions now. Asked patient to call RN and ask her to call anesthesia if bolus does not relieve discomfort.  Record the patient's initial score and the patient's pain goal.   Pain: 4  Pain Goal: 5 [facility] wants you to be able to say your pain was always managed very well.  Kiyani Jernigan 12/06/2017

## 2017-12-06 NOTE — Anesthesia Postprocedure Evaluation (Signed)
Anesthesia Post Note  Patient: Joy [name]  Procedure(s) Performed: AN AD HOC LABOR EPIDURAL     Patient location during evaluation: Mother Baby Anesthesia Type: Epidural Level of consciousness: awake Pain management: satisfactory to patient Vital Signs Assessment: post-procedure vital signs reviewed and stable Respiratory status: spontaneous breathing Cardiovascular status: stable Anesthetic complications: no    Last Vitals:  Vitals:   12/06/17 1150 12/06/17 1330  BP: 131/77 (!) 150/77  Pulse: 91 80  Resp: 20 18  Temp: 36.9 C 36.6 C    Last Pain:  Vitals:   12/06/17 1334  TempSrc:   PainSc: 0-No pain   Pain Goal:                 KeyCorpBURGER,Shondra Capps

## 2017-12-06 NOTE — Anesthesia Preprocedure Evaluation (Signed)
Anesthesia Evaluation  Patient identified by MRN, date of birth, ID band Patient awake    Reviewed: Allergy & Precautions, H&P , NPO status , Patient's Chart, lab work & pertinent test results  History of Anesthesia Complications Negative for: history of anesthetic complications  Airway Mallampati: II  TM Distance: >3 FB Neck ROM: full    Dental no notable dental hx. (+) Teeth Intact   Pulmonary neg pulmonary ROS, former smoker,    Pulmonary exam normal breath sounds clear to auscultation       Cardiovascular negative cardio ROS Normal cardiovascular exam Rhythm:regular Rate:Normal     Neuro/Psych negative neurological ROS  negative psych ROS   GI/Hepatic negative GI ROS, Neg liver ROS,   Endo/Other  Morbid obesity  Renal/GU negative Renal ROS  negative genitourinary   Musculoskeletal   Abdominal   Peds  Hematology negative hematology ROS (+)   Anesthesia Other Findings   Reproductive/Obstetrics (+) Pregnancy                             Anesthesia Physical Anesthesia Plan  ASA: III  Anesthesia Plan: Epidural   Post-op Pain Management:    Induction:   PONV Risk Score and Plan:   Airway Management Planned:   Additional Equipment:   Intra-op Plan:   Post-operative Plan:   Informed Consent: I have reviewed the patients History and Physical, chart, labs and discussed the procedure including the risks, benefits and alternatives for the proposed anesthesia with the patient or authorized representative who has indicated his/her understanding and acceptance.       Plan Discussed with:   Anesthesia Plan Comments:         Anesthesia Quick Evaluation  

## 2017-12-06 NOTE — Progress Notes (Signed)
Called to repair 2nd degree laceration, on inspection, patient has 3A perineal laceration with minimal bleeding. Digital rectal exam showed no tear through to rectum. The EAS sphincter was noted to have a minimal tear and this was repaired with 2-0 vicryl interrupted sutures. The perineal body was then reinforced with 2-0 Vicryl interrupted sutures. The 2nd degree was then repaired in the usual fashion with 2-0 Vicryl and 3-0 vicryl for the subcuticular stitch. Hemostasis noted at the site. Another digital rectal exam demonstrated no sutures palpable through to rectum.  Patient tolerated well. Will give ppx antibiotics for 3rd degree.   Baldemar LenisK. Meryl Ilay Capshaw, M.D. Attending Center for [facility] Midwife(Faculty Practice)

## 2017-12-06 NOTE — MAU Note (Signed)
Pt states water broke at 0100-clear fluid. Reports regular contractions although untimed. Good fetal movement. Cervix 3cm on last exam.

## 2017-12-06 NOTE — Anesthesia Procedure Notes (Signed)
Epidural Patient location during procedure: OB  Staffing Anesthesiologist: Doyce Stonehouse, MD Performed: anesthesiologist   Preanesthetic Checklist Completed: patient identified, site marked, surgical consent, pre-op evaluation, timeout performed, IV checked, risks and benefits discussed and monitors and equipment checked  Epidural Patient position: sitting Prep: DuraPrep Patient monitoring: heart rate, continuous pulse ox and blood pressure Approach: right paramedian Location: L3-L4 Injection technique: LOR saline  Needle:  Needle type: Tuohy  Needle gauge: 17 G Needle length: 9 cm and 9 Needle insertion depth: 9 cm Catheter type: closed end flexible Catheter size: 20 Guage Catheter at skin depth: 14 cm Test dose: negative  Assessment Events: blood not aspirated, injection not painful, no injection resistance, negative IV test and no paresthesia  Additional Notes Patient identified. Risks/Benefits/Options discussed with patient including but not limited to bleeding, infection, nerve damage, paralysis, failed block, incomplete pain control, headache, blood pressure changes, nausea, vomiting, reactions to medication both or allergic, itching and postpartum back pain. Confirmed with bedside nurse the patient's most recent platelet count. Confirmed with patient that they are not currently taking any anticoagulation, have any bleeding history or any family history of bleeding disorders. Patient expressed understanding and wished to proceed. All questions were answered. Sterile technique was used throughout the entire procedure. Please see nursing notes for vital signs. Test dose was given through epidural needle and negative prior to continuing to dose epidural or start infusion. Warning signs of high block given to the patient including shortness of breath, tingling/numbness in hands, complete motor block, or any concerning symptoms with instructions to call for help. Patient was given  instructions on fall risk and not to get out of bed. All questions and concerns addressed with instructions to call with any issues.     

## 2017-12-06 NOTE — H&P (Addendum)
Joy [name] is a 27 y.o. female G2P1001 @[redacted]w[redacted]d  presenting for SROM with onset of labor. Pt with genital HSV, last outbreak ended 11/19/17.  She reports lesions are all cleared and she has had no symptoms for 2 full weeks.  She is taking Valtrex as prescribed. Otherwise pregnancy has been uncomplicated.   Nursing Staff Provider  Office Location  [facility] Dating   Early [place]  Language   English Anatomy US  Normal   Flu Vaccine  Declined Genetic Screen  NIPS:   AFP:   First Screen:  Quad:  Declined all   TDaP vaccine  Declined Hgb A1C or  GTT Early - 06/02/17 5.7 Third trimester [id number]  Rhogam   NA   LAB RESULTS   Feeding Plan  Breast Blood Type O/Positive/-- (05/29 1610)   Contraception counseled, open to IUD (has had Nexplanon)  Antibody Negative (05/29 0926)  Circumcision  outpatient Rubella 12.00 (05/29 0926)  Pediatrician   [facility] RPR Non Reactive (08/22 0922)   Support Person  Mom HBsAg Negative (05/29 0926)   Prenatal Classes  offered HIV Non Reactive (08/22 9604)  BTL Consent  na GBS  negative  VBAC Consent  NA Pap  Normal 2017 - ROI sent to [facility]    Hgb Electro  Normal    CF     SMA     Waterbirth  [ ]  Class [ ]  Consent [ ]  CNM visit    OB History    Gravida  2   Para  1   Term  1   Preterm      AB      Living  1     SAB      TAB      Ectopic      Multiple  0   Live Births  1          Past Medical History:  Diagnosis Date  . BV (bacterial vaginosis)   . Genital herpes    Past Surgical History:  Procedure Laterality Date  . WISDOM TOOTH EXTRACTION     Family History: family history includes Healthy in her father and mother. Social History:  reports that she quit smoking about 4 years ago. She has never used smokeless tobacco. She reports that she drank alcohol. She reports that she has current or past drug history. Drug: Marijuana.     Maternal Diabetes: No Genetic Screening: Declined Maternal Ultrasounds/Referrals: Normal Fetal  Ultrasounds or other Referrals:  None Maternal Substance Abuse:  No Significant Maternal Medications:  Meds include: Other:  Significant Maternal Lab Results:  Lab values include: Group B Strep negative Other Comments:  Antiretroviral medications for HIV positive  Review of Systems  Constitutional: Negative for chills and fever.  Respiratory: Negative for shortness of breath.   Cardiovascular: Negative for chest pain.  Gastrointestinal: Positive for abdominal pain. Negative for constipation, diarrhea and vomiting.  Neurological: Negative for dizziness and headaches.  All other systems reviewed and are negative.  Maternal Medical History:  Reason for admission: Rupture of membranes and contractions.   Contractions: Onset was 3-5 hours ago.   Frequency: regular.   Duration is approximately 1 minute.   Perceived severity is moderate.    Fetal activity: Perceived fetal activity is normal.   Last perceived fetal movement was within the past hour.    Prenatal complications: no prenatal complications Prenatal Complications - Diabetes: none.    Dilation: 4.5 Station: -3 Exam by:: Joy [name] Last menstrual period 02/09/2017, unknown  if currently breastfeeding. Maternal Exam:  Uterine Assessment: Contraction strength is moderate.  Contraction duration is 70 seconds. Contraction frequency is regular.   Abdomen: Fetal presentation: vertex  Introitus: Ferning test: positive.  Amniotic fluid character: clear.  Pelvis: adequate for delivery.      Fetal Exam Fetal Monitor Review: Mode: ultrasound.   Baseline rate: 135.  Variability: moderate (6-25 bpm).   Pattern: accelerations present and no decelerations.    Fetal State Assessment: Category I - tracings are normal.     Physical Exam  Nursing note and vitals reviewed. Constitutional: She is oriented to person, place, and time. She appears well-developed and well-nourished.  Neck: Normal range of motion.  Cardiovascular:  Normal rate, regular rhythm and normal heart sounds.  Respiratory: Effort normal and breath sounds normal.  GI: Soft.  Genitourinary: There is no lesion on the right labia. There is no lesion on the left labia.  Genitourinary Comments: SSE, no lesions noted on vaginal walls or cervix, or on external inspection of labia. Pt without symptoms for 2 full weeks.   Musculoskeletal: Normal range of motion.  Neurological: She is alert and oriented to person, place, and time.  Skin: Skin is warm and dry.  Psychiatric: She has a normal mood and affect. Her behavior is normal. Judgment and thought content normal.    Prenatal labs: ABO, Rh: O/Positive/-- (05/29 0926) Antibody: Negative (05/29 0926) Rubella: 12.00 (05/29 0926) RPR: Non Reactive (08/22 0922)  HBsAg: Negative (05/29 0926)  HIV: Non Reactive (08/22 0922)  GBS:   Negative  Assessment/Plan: 27 y.o. G2P1001 @[redacted]w[redacted]d  SROM and active labor at term GBS negative Genital HSV, last outbreak ended 11/19/17  Will have Dr. Macon Large discuss mode of delivery with patient given recent HSV infection   Joy [name] 12/06/2017, 2:38 AM    Attestation of Attending Supervision of Advanced Practice Provider (PA/CNM/NP): Evaluation and management procedures were performed by the Advanced Practice Provider under my supervision and collaboration.  I have reviewed the Advanced Practice Provider's note and chart, and I agree with the management and plan.  Had a lengthy discussion with the patient around 0630 this morning about this reported episode of recent recurrent genital HSV infection and concern about neonatal HSV encephalitis.  Reassured about absence of lesions seen on detailed perineal and speculum exam, and patient denies any prodromal symptoms, but did emphasize that there was still a risk of HSV being secreted in the area which could theoretically lead to increased risk of neonatal HSV encephalitis. Talked about increased morbidity and  mortality associated with this condition, hence the concern about it.  Recommended cesarean delivery as a way to reduce this risk; this is usually recommended by our group if the HSV outbreak occurs within a month of delivery time.  Patient reports that she adamantly does not want to have a cesarean section, has doubled up on her medications since the recurrent outbreak started and feels confident that the chance of neonatal infection is very low given that that this was not a primary outbreak.  She was informed that the Pediatric team will be notified, and they may decide to give prophylactic treatment and/or observe the neonate in a more detailed fashion.   Of note, patient had been seen several times in the office and MAU around the time of this outbreak, and it was never documented. When she was asked about this, she reports that she did not mention it as it was resolving/resolved during those encounters and she was already self-treating. She  was told of the importance of communication about this condition, as this counseling could have occurred prior to this morning.  She acknowledged understanding of this concern.  Will proceed with vaginal delivery as planned for now, expectant management for now. GBS negative. Reassuring FHR tracing.   Will inform oncoming obstetric team about this discussion. Of note, called the nursery and talked to PediatricTeaching Service Attending (Dr. Charise KillianPam Reitnauer), however the baby's pediatrician is [facility]. Dr. Erik Obeyeitnauer will attempt to let them know, but someone from our griup will also notify the appropriate Pediatrician.   Patient's family is unaware of this diagnosis, and patient wants to ensure that all discussions about this are done without family members being present.   Joy CollinsUGONNA  Zaylia Riolo, MD, FACOG Obstetrician & Gynecologist, [facility] Center for [facility], [facility]

## 2017-12-07 ENCOUNTER — Inpatient Hospital Stay (HOSPITAL_COMMUNITY): Admission: RE | Admit: 2017-12-07 | Payer: No Typology Code available for payment source | Source: Ambulatory Visit

## 2017-12-07 NOTE — Progress Notes (Signed)
POSTPARTUM PROGRESS NOTE  Post Partum Day 1  Subjective:  Joy [name] is a 27 y.o. [id number] s/p SVD at 4735w6d.  She reports she is doing well. No acute events overnight. She denies any problems with ambulating, voiding or po intake. Denies nausea or vomiting.  Pain is well controlled.  Lochia is moderate. She is experiencing flatus but has not moved her bowels.   Objective: Blood pressure 114/60, pulse 92, temperature 98.9 F (37.2 C), temperature source Axillary, resp. rate 18, last menstrual period 02/09/2017, SpO2 99 %, unknown if currently breastfeeding.  Physical Exam:  General: alert, cooperative and no distress Chest: no respiratory distress Heart:regular rate, distal pulses intact Abdomen: soft, nontender,  Uterine Fundus: firm, appropriately tender DVT Evaluation: No calf swelling or tenderness Skin: warm, dry  Recent Labs    12/06/17 0224  HGB 11.8*  HCT 34.5*    Assessment/Plan: Joy [name] is a 27 y.o. [id number] s/p SVD at 3535w6d. Her delivery was complicated by an active HSV infection two weeks prior to SVD. She's on valtrex and is currently asymptomatic.   PPD#1 - Doing well  Routine postpartum care  Contraception: considering IUD Feeding: Breast  Dispo: Plan for discharge tomorrow   LOS: 1 day   Genene ChurnJohn Cook, MS3 12/07/2017, 8:41 AM   Midwife attestation Post Partum Day 1 I have seen and examined this patient and agree with above documentation in the student's note.   Joy [name] is a 27 y.o. G2P2002 s/p SVD.  Pt denies problems with ambulating, voiding or po intake. Pain is well controlled. Method of Feeding: breast  PE:  Gen: well appearing Heart: reg rate Lungs: normal WOB Fundus firm Ext: soft, no pain, no edema  Assessment: S/p SVD PPD #1  Plan for discharge: tomorrow  Donette LarryMelanie Andry Bogden, CNM 1:29 PM

## 2017-12-07 NOTE — Progress Notes (Signed)
CSW received consult for hx of marijuana use.  Referral was screened out due to the following: ~MOB had no documented substance use after initial prenatal visit/+UPT. ~MOB had no positive drug screens after initial prenatal visit/+UPT. ~Baby's UDS is negative.  Please consult CSW if current concerns arise or by MOB's request.  CSW will monitor CDS results and make report to Child Protective Services if warranted.  Mita Vallo, LCSWA Clinical Social Worker [facility] Cell#: [phone]  

## 2017-12-08 MED ORDER — POLYETHYLENE GLYCOL 3350 17 GM/SCOOP PO POWD: 1.0000 | Freq: Once | ORAL | 0 refills | Status: AC

## 2017-12-08 MED ORDER — SENNOSIDES-DOCUSATE SODIUM 8.6-50 MG PO TABS: 2.0000 | ORAL_TABLET | ORAL | 0 refills | Status: AC

## 2017-12-08 MED ORDER — IBUPROFEN 600 MG PO TABS: 600.0000 mg | ORAL_TABLET | Freq: Four times a day (QID) | ORAL | 0 refills | Status: DC

## 2017-12-08 NOTE — Lactation Note (Signed)
This note was copied from a baby's chart. Lactation Consultation Note  Patient Name: Joy [name] MVHQI'OToday's Date: 12/08/2017 Reason for consult: Follow-up assessment;Infant weight loss;Term  Visited with P2 Mom of term baby on day of discharge.  Baby 47 hrs old, and at 8.6% weight loss.   Mom states baby was sleepy the first day, and is now "making up for it" with lost of feedings last night.  Mom declined feeling nipple pain with latches. Baby being held while Mom ordering breakfast, and rooting and trying to suck on Mom's face. Asked to assist/assess with positioning and latch.  Mom trying to latch baby in cradle hold.  Baby not turned into breast, while latched he is turning his head and losing the depth.  Offered to assist with using cross cradle hold. Hand expression assisted with and colostrum drops expressed. Baby able to attain a deeper latch, and baby relaxed more.  Reviewed importance of pillow support under baby.   Baby noted to have deep jaw extensions and swallows identified for Mom.  Taught how to use alternate breast compression to increase milk transfer. Encouraged continued STS, and feeding baby often on cue.  Goal of >8 feedings per 24 hrs.  Due to weight loss, recommended hand expression and spoon feeding after feeding, or double pumping pc to stimulate her milk supply.  Mom knows to spoon feed baby any EBM back to baby. Engorgement prevention and treatment reviewed. Mom aware of OP lactation support available to her, and encouraged to call.   Consult Status Consult Status: Complete Date: 12/08/17 Follow-up type: Call as needed    [name], [name] E 12/08/2017, 9:05 AM
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
## 2017-12-27 ENCOUNTER — Ambulatory Visit: Payer: Medicaid Other | Admitting: Family Medicine

## 2018-01-11 ENCOUNTER — Other Ambulatory Visit (HOSPITAL_COMMUNITY)
Admission: RE | Admit: 2018-01-11 | Discharge: 2018-01-11 | Disposition: A | Discharge status: Home / Self Care | Payer: Medicaid Other | Source: Ambulatory Visit | Attending: Obstetrics & Gynecology | Admitting: Obstetrics & Gynecology

## 2018-01-11 ENCOUNTER — Ambulatory Visit (INDEPENDENT_AMBULATORY_CARE_PROVIDER_SITE_OTHER): Payer: Medicaid Other | Admitting: Obstetrics & Gynecology

## 2018-01-11 ENCOUNTER — Encounter: Payer: Self-pay | Admitting: Obstetrics & Gynecology

## 2018-01-11 ENCOUNTER — Encounter: Payer: Self-pay | Admitting: *Deleted

## 2018-01-11 VITALS — BP 122/61 | HR 63 | Wt 287.6 lb

## 2018-01-11 DIAGNOSIS — Z0000 Encounter for general adult medical examination without abnormal findings: Secondary | ICD-10-CM | POA: Insufficient documentation

## 2018-01-11 DIAGNOSIS — Z1389 Encounter for screening for other disorder: Secondary | ICD-10-CM | POA: Diagnosis not present

## 2018-01-11 DIAGNOSIS — N898 Other specified noninflammatory disorders of vagina: Secondary | ICD-10-CM | POA: Insufficient documentation

## 2018-01-11 NOTE — Progress Notes (Signed)
Subjective:     Joy [name] is a 28 y.o. female who presents for a postpartum visit. She is 5 weeks postpartum following a spontaneous vaginal delivery. I have fully reviewed the prenatal and intrapartum course. The delivery was at [redacted]w[redacted]d gestational weeks. Outcome: spontaneous vaginal delivery. Anesthesia: epidural. Postpartum course has been complicated by a 3rd degree laceration. Baby's course has been uncomplicated. Baby is feeding by breast. Bleeding staining only. Bowel function is normal. Bladder function is normal. Patient is not sexually active. Contraception method is abstinence. Postpartum depression screening: negative.  The following portions of the patient's history were reviewed and updated as appropriate: allergies, current medications, past family history, past medical history, past social history, past surgical history and problem list.  Review of Systems Pertinent items are noted in HPI.   Objective:    There were no vitals taken for this visit.  General:  alert   Breasts:  inspection negative, no nipple discharge or bleeding, no masses or nodularity palpable  Lungs: clear to auscultation bilaterally  Heart:  regular rate and rhythm, S1, S2 normal, no murmur, click, rub or gallop  Abdomen: soft, non-tender; bowel sounds normal; no masses,  no organomegaly   Vulva:  normal  Vagina: normal vagina  Cervix:  anteverted  Corpus: normal size, contour, position, consistency, mobility, non-tender  Adnexa:  not evaluated  Rectal Exam: Not performed.        Assessment:    Normal postpartum exam. Pap smear done at today's visit.   Plan:    1. Contraception: abstinence 2. Vaginal odor per patient- testing on pap 3. Follow up in: 1 year or as needed.

## 2018-01-12 LAB — CYTOLOGY - PAP
Bacterial vaginitis: NEGATIVE
Candida vaginitis: NEGATIVE
Diagnosis: NEGATIVE
Trichomonas: NEGATIVE

## 2018-01-13 ENCOUNTER — Telehealth: Payer: Self-pay | Admitting: Clinical

## 2018-01-13 NOTE — Telephone Encounter (Signed)
Left HIPPA-compliant message to call back Zachrey Deutscher from [facility]  at [phone].  

## 2018-01-17 ENCOUNTER — Ambulatory Visit: Payer: Medicaid Other | Admitting: Family Medicine

## 2018-01-25 ENCOUNTER — Ambulatory Visit: Payer: Medicaid Other | Admitting: Obstetrics & Gynecology

## 2018-07-12 ENCOUNTER — Other Ambulatory Visit (HOSPITAL_COMMUNITY)
Admission: RE | Admit: 2018-07-12 | Discharge: 2018-07-12 | Disposition: A | Discharge status: Home / Self Care | Payer: Medicaid Other | Source: Ambulatory Visit | Attending: Nurse Practitioner | Admitting: Nurse Practitioner

## 2018-07-12 DIAGNOSIS — N898 Other specified noninflammatory disorders of vagina: Secondary | ICD-10-CM | POA: Insufficient documentation

## 2018-07-13 ENCOUNTER — Other Ambulatory Visit: Payer: Self-pay

## 2018-07-15 LAB — URINE CYTOLOGY ANCILLARY ONLY
Bacterial vaginitis: NEGATIVE
Candida vaginitis: NEGATIVE
Chlamydia: NEGATIVE
Neisseria Gonorrhea: NEGATIVE
Trichomonas: NEGATIVE

## 2018-08-23 ENCOUNTER — Encounter (HOSPITAL_COMMUNITY): Payer: Self-pay

## 2018-08-23 ENCOUNTER — Ambulatory Visit (HOSPITAL_COMMUNITY)
Admission: EM | Admit: 2018-08-23 | Discharge: 2018-08-23 | Disposition: A | Discharge status: Home / Self Care | Payer: Medicaid Other | Attending: Family Medicine | Admitting: Family Medicine

## 2018-08-23 ENCOUNTER — Other Ambulatory Visit: Payer: Self-pay

## 2018-08-23 DIAGNOSIS — N898 Other specified noninflammatory disorders of vagina: Secondary | ICD-10-CM | POA: Diagnosis present

## 2018-08-23 DIAGNOSIS — Z3202 Encounter for pregnancy test, result negative: Secondary | ICD-10-CM

## 2018-08-23 DIAGNOSIS — N760 Acute vaginitis: Secondary | ICD-10-CM

## 2018-08-23 LAB — POCT URINALYSIS DIP (DEVICE)
Bilirubin Urine: NEGATIVE
Glucose, UA: NEGATIVE mg/dL
Hgb urine dipstick: NEGATIVE
Ketones, ur: NEGATIVE mg/dL
Nitrite: NEGATIVE
Protein, ur: NEGATIVE mg/dL
Specific Gravity, Urine: 1.025 (ref 1.005–1.030)
Urobilinogen, UA: 0.2 mg/dL (ref 0.0–1.0)
pH: 7 (ref 5.0–8.0)

## 2018-08-23 LAB — POCT PREGNANCY, URINE: Preg Test, Ur: NEGATIVE

## 2018-08-23 MED ORDER — METRONIDAZOLE 0.75 % VA GEL: 1.0000 | Freq: Every day | VAGINAL | 0 refills | Status: AC

## 2018-08-23 NOTE — ED Triage Notes (Signed)
Patient presents to Urgent Care with complaints of itchy vaginal dishcarge since a few weeks ago. Patient reports she went to another facility thinking she had an STD but they said it came back clean and that she did not need medication, symptoms persist.

## 2018-08-23 NOTE — Discharge Instructions (Signed)
May begin using metrogel nightly at bedtime to treat BV  We are testing you for Gonorrhea, Chlamydia, Trichomonas, Yeast and Bacterial Vaginosis. We will call you if anything is positive and let you know if you require any further treatment. Please inform partners of any positive results.   Please return if symptoms not improving with treatment, development of fever, nausea, vomiting, abdominal pain.
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
## 2018-08-24 LAB — URINE CULTURE

## 2018-08-25 LAB — CERVICOVAGINAL ANCILLARY ONLY
Bacterial vaginitis: NEGATIVE
Candida vaginitis: NEGATIVE
Chlamydia: NEGATIVE
Neisseria Gonorrhea: NEGATIVE
Trichomonas: NEGATIVE

## 2019-03-31 ENCOUNTER — Ambulatory Visit (HOSPITAL_COMMUNITY)
Admission: EM | Admit: 2019-03-31 | Discharge: 2019-03-31 | Disposition: A | Discharge status: Home / Self Care | Payer: Medicaid Other | Attending: Urgent Care | Admitting: Urgent Care

## 2019-03-31 ENCOUNTER — Encounter (HOSPITAL_COMMUNITY): Payer: Self-pay

## 2019-03-31 ENCOUNTER — Other Ambulatory Visit: Payer: Self-pay

## 2019-03-31 DIAGNOSIS — R21 Rash and other nonspecific skin eruption: Secondary | ICD-10-CM

## 2019-03-31 DIAGNOSIS — L299 Pruritus, unspecified: Secondary | ICD-10-CM

## 2019-03-31 MED ORDER — HYDROXYZINE HCL 25 MG PO TABS: 12.5000 mg | ORAL_TABLET | Freq: Three times a day (TID) | ORAL | 0 refills | Status: DC | PRN

## 2019-03-31 MED ORDER — FLUOCINOLONE ACETONIDE BODY 0.01 % EX OIL: 1.0000 "application " | TOPICAL_OIL | Freq: Two times a day (BID) | CUTANEOUS | 0 refills | Status: DC

## 2019-03-31 NOTE — ED Triage Notes (Signed)
Pt presents with a rash that's covering her chest, breast, neck, shoulders, upper back, and small other areas over her body for 1 wk.

## 2019-03-31 NOTE — ED Provider Notes (Signed)
[place]   MRN: [id number] DOB: 29-May-1990  Subjective:   Joy [name] is a 29 y.o. female presenting for 1 week history of progressively worsening rash over her neck chest and upper back.  Patient states she has a history of eczema but this is been a very severe outbreak.  Denies any new exposures.  Rash is primarily itching and when she starts to scratch can be stinging.  Has had an occasional cough that resolved.  Reports that her mother was vaccinated for Covid and advised her to get tested for Covid given her cough and rash.  Patient went and did this, testing was negative.  Has been using calamine lotion and over-the-counter topical creams with very temporary relief.  No current facility-administered medications for this encounter.  Current Outpatient Medications:  .  triamcinolone (KENALOG) 0.025 % ointment, Apply 1 application topically 2 (two) times daily., Disp: 80 g, Rfl: 1 .  valACYclovir (VALTREX) 500 MG tablet, Take 1 tablet (500 mg total) by mouth 2 (two) times daily. (Patient not taking: Reported on 01/11/2018), Disp: 60 tablet, Rfl: 6   No Known Allergies  Past Medical History:  Diagnosis Date  . BV (bacterial vaginosis)   . Genital herpes      Past Surgical History:  Procedure Laterality Date  . WISDOM TOOTH EXTRACTION      Family History  Problem Relation Age of Onset  . Healthy Mother   . Healthy Father     Social History   Tobacco Use  . Smoking status: Former Smoker    Quit date: 11/19/2013    Years since quitting: 5.3  . Smokeless tobacco: Never Used  Substance Use Topics  . Alcohol use: Yes    Comment: once a month  . Drug use: Yes    Types: Marijuana    Comment: [id number]    ROS   Objective:   Vitals: BP 111/62 (BP Location: Right Arm)   Pulse 64   Temp 98.2 F (36.8 C) (Oral)   Resp 15   Wt 250 lb (113.4 kg)   LMP 03/28/2019 (Exact Date)   SpO2 99%   Breastfeeding No   BMI 39.16 kg/m   Physical Exam  Constitutional:      General: She is not in acute distress.    Appearance: Normal appearance. She is well-developed. She is not ill-appearing, toxic-appearing or diaphoretic.  HENT:     Head: Normocephalic and atraumatic.     Nose: Nose normal.     Mouth/Throat:     Mouth: Mucous membranes are moist.     Pharynx: Oropharynx is clear.  Eyes:     General: No scleral icterus.       Right eye: No discharge.        Left eye: No discharge.     Extraocular Movements: Extraocular movements intact.     Pupils: Pupils are equal, round, and reactive to light.  Cardiovascular:     Rate and Rhythm: Normal rate.  Pulmonary:     Effort: Pulmonary effort is normal.  Skin:    General: Skin is warm and dry.     Findings: Rash (Multiple patches of solitary nodules worst over anterior neck, upper portion of chest as depicted; also has similar resolving rash over flexural surfaces of bilateral elbows) present.  Neurological:     General: No focal deficit present.     Mental Status: She is alert and oriented to person, place, and time.  Psychiatric:  Mood and Affect: Mood normal.        Behavior: Behavior normal.        Thought Content: Thought content normal.        Judgment: Judgment normal.       Assessment and Plan :   1. Rash and nonspecific skin eruption   2. Itching     Start Derma-Smoothe to address atopic dermatitis, possible viral exanthem. Use hydroxyzine for itching. Deferred COVID 19 testing given that she was tested earlier this week and was negative. Recheck if sx persist or worsen. Counseled patient on potential for adverse effects with medications prescribed/recommended today, ER and return-to-clinic precautions discussed, patient verbalized understanding.    Wallis Bamberg, [place] 04/01/19 [phone]

## 2019-05-19 ENCOUNTER — Ambulatory Visit (HOSPITAL_COMMUNITY)
Admission: EM | Admit: 2019-05-19 | Discharge: 2019-05-19 | Disposition: A | Discharge status: Home / Self Care | Payer: Medicaid Other | Attending: Emergency Medicine | Admitting: Emergency Medicine

## 2019-05-19 ENCOUNTER — Other Ambulatory Visit: Payer: Self-pay

## 2019-05-19 ENCOUNTER — Encounter (HOSPITAL_COMMUNITY): Payer: Self-pay

## 2019-05-19 DIAGNOSIS — N760 Acute vaginitis: Secondary | ICD-10-CM

## 2019-05-19 DIAGNOSIS — A6000 Herpesviral infection of urogenital system, unspecified: Secondary | ICD-10-CM | POA: Insufficient documentation

## 2019-05-19 DIAGNOSIS — B9689 Other specified bacterial agents as the cause of diseases classified elsewhere: Secondary | ICD-10-CM | POA: Diagnosis present

## 2019-05-19 DIAGNOSIS — A6004 Herpesviral vulvovaginitis: Secondary | ICD-10-CM | POA: Diagnosis present

## 2019-05-19 DIAGNOSIS — Z760 Encounter for issue of repeat prescription: Secondary | ICD-10-CM | POA: Insufficient documentation

## 2019-05-19 LAB — HIV ANTIBODY (ROUTINE TESTING W REFLEX): HIV Screen 4th Generation wRfx: NONREACTIVE

## 2019-05-19 MED ORDER — METRONIDAZOLE 500 MG PO TABS: 500.0000 mg | ORAL_TABLET | Freq: Two times a day (BID) | ORAL | 0 refills | Status: AC

## 2019-05-19 MED ORDER — VALACYCLOVIR HCL 1 G PO TABS: 1000.0000 mg | ORAL_TABLET | Freq: Two times a day (BID) | ORAL | 3 refills | Status: AC

## 2019-05-19 NOTE — Discharge Instructions (Addendum)
Call here in several days for your results, discontinue flagyl if your test for BV comes back negative.   Below is a list of primary care practices who are taking new patients for you to follow-up with.  [facility] internal medicine clinic Ground Floor - [facility], [address] [place], [place], [place] [address] [phone]  [facility] at [facility] [address] [place], [place] [address] [phone]  [facility] [address] [place], [place] [address] [phone]  [facility] [phone] [address] [place] [address]  [facility]: [address] [place] [address]  [phone]  [facility]: [address] [place] [address]   [phone]  [facility]: [address] [place] [address]  [phone]  [place] [facility] : [address] [place] [address] [phone]  [facility]: [address] [place] [address] [phone]  [facility]: [address] [place] [address] [phone]  Dr. Oneal Grout [address] [facility] [place] [address]  [phone]  Dr. Jackie Plum, [facility]. [address] [place], [place] [address]  [phone]  Go to www.goodrx.com to look up your medications. This will give you a list of where you can find your prescriptions at the most affordable prices. Or ask the pharmacist what the cash price is, or if they have any other discount programs available to help make your medication more affordable. This can be less expensive than what you would pay with insurance.

## 2019-05-19 NOTE — ED Provider Notes (Signed)
HPI  SUBJECTIVE:  Joy [name] is a 29 y.o. female who presents for refill of her Valtrex.  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  She reports genital rash, itching.  She also reports odorous thin watery vaginal discharge.  No urinary complaints.  No fevers, body aches, abdominal, back, pelvic pain.  No vaginal bleeding.  She is sexually active with a female who is asymptomatic to her knowledge.  She denies having any other sexual partners.  She is not sure if he does.  STDs however not a concern today.  She tried taking more showers with temporary relief in the itching.  Symptoms are worse with not bathing.  No recent antibiotics.  She does not use perfumed soaps or body washes.  She has a past medical history of HSV-2, BV.  No history of gonorrhea, chlamydia, HIV, syphilis, trichomonas, yeast, diabetes.  LMP: 3 weeks ago.  Denies the possibility of being pregnant.  PMD: None.    Past Medical History:  Diagnosis Date  . BV (bacterial vaginosis)   . Genital herpes     Past Surgical History:  Procedure Laterality Date  . WISDOM TOOTH EXTRACTION      Family History  Problem Relation Age of Onset  . Healthy Mother   . Healthy Father     Social History   Tobacco Use  . Smoking status: Former Smoker    Quit date: 11/19/2013    Years since quitting: 5.4  . Smokeless tobacco: Never Used  Substance Use Topics  . Alcohol use: Yes    Comment: once a month  . Drug use: Yes    Types: Marijuana    Comment: [id number]    No current facility-administered medications for this encounter.  Current Outpatient Medications:  .  Fluocinolone Acetonide Body (DERMA-SMOOTHE/FS BODY) 0.01 % OIL, Apply 1 application topically in the morning and at bedtime., Disp: 500 mL, Rfl: 0 .  hydrOXYzine (ATARAX/VISTARIL) 25 MG tablet, Take 0.5-1 tablets (12.5-25 mg total) by mouth every 8 (eight) hours as needed for itching., Disp: 30 tablet, Rfl: 0 .  metroNIDAZOLE (FLAGYL)  500 MG tablet, Take 1 tablet (500 mg total) by mouth 2 (two) times daily for 7 days., Disp: 14 tablet, Rfl: 0 .  triamcinolone (KENALOG) 0.025 % ointment, Apply 1 application topically 2 (two) times daily., Disp: 80 g, Rfl: 1 .  valACYclovir (VALTREX) 1000 MG tablet, Take 1 tablet (1,000 mg total) by mouth 2 (two) times daily for 10 days., Disp: 10 tablet, Rfl: 3  No Known Allergies   ROS  As noted in HPI.   Physical Exam  BP 104/71 (BP Location: Right Arm)   Pulse 64   Temp 98.5 F (36.9 C) (Oral)   Resp 17   LMP  (Within Weeks) Comment: 2 weeks  SpO2 97%   Constitutional: Well developed, well nourished, no acute distress Eyes:  EOMI, conjunctiva normal bilaterally HENT: Normocephalic, atraumatic,mucus membranes moist Respiratory: Normal inspiratory effort Cardiovascular: Normal rate GI: nondistended soft nontender no suprapubic or flank tenderness Back: No CVAT skin: No rash, skin intact Musculoskeletal: no deformities Neurologic: Alert & oriented x 3, no focal neuro deficits Psychiatric: Speech and behavior appropriate   ED Course   Medications - No data to display  Orders Placed This Encounter  Procedures  . RPR    Standing Status:   Standing    Number of Occurrences:   1  . HIV antibody    Standing Status:  Standing    Number of Occurrences:   1    No results found for this or any previous visit (from the past 24 hour(s)). No results found.  ED Clinical Impression  1. Herpes simplex vulvovaginitis   2. Genital herpes simplex, unspecified site   3. Medication refill   4. BV (bacterial vaginosis)      ED Assessment/Plan  1.  Current HSV-2 outbreak.  Medication refill.  Refilling Valtrex 1 g twice daily for 10 days for outbreaks.  3 refills.   2.  Odorous vaginal discharge.  Presumed BV.  Sent off gonorrhea, chlamydia, trichomonas, BV, yeast.  Also checking HIV, RPR.   will treat empirically for BV.  Will not treat empirically for STDs today as they  are not a concern.  Advised patient to call here in several days and get her results.  If she does not have BV, then she is to discontinue the Flagyl.  Provided primary care list for ongoing care.  Discussed labs, MDM, treatment plan, and plan for follow-up with patient. Discussed sn/sx that should prompt return to the ED. patient agrees with plan.   Meds ordered this encounter  Medications  . valACYclovir (VALTREX) 1000 MG tablet    Sig: Take 1 tablet (1,000 mg total) by mouth 2 (two) times daily for 10 days.    Dispense:  10 tablet    Refill:  3  . metroNIDAZOLE (FLAGYL) 500 MG tablet    Sig: Take 1 tablet (500 mg total) by mouth 2 (two) times daily for 7 days.    Dispense:  14 tablet    Refill:  0    *This clinic note was created using [employer]. Therefore, there may be occasional mistakes despite careful proofreading.   ?    Melynda Ripple, MD 05/19/19 2014

## 2019-05-19 NOTE — ED Triage Notes (Signed)
Pt reports white vaginal discharge, vaginal itchiness x 2 days. Pt requested medication refill for herpes flare.

## 2019-05-20 LAB — RPR: RPR Ser Ql: NONREACTIVE

## 2019-05-21 ENCOUNTER — Telehealth (HOSPITAL_COMMUNITY): Payer: Self-pay

## 2019-05-21 MED ORDER — METRONIDAZOLE 0.75 % VA GEL: VAGINAL | 0 refills | Status: DC

## 2019-05-21 NOTE — Telephone Encounter (Signed)
Pt was recently seen on 5/14, There is a issue with her prescription and she would like to have her medication change from the tablet to the cream.

## 2019-05-21 NOTE — Telephone Encounter (Signed)
Patient requesting metronidazole cream.  Will send in prescription of metronidazole vaginal gel 0.75% 1 applicator 5 g vaginally nightly for 5 days to pharmacy on record. AM

## 2019-05-22 LAB — CERVICOVAGINAL ANCILLARY ONLY
Bacterial Vaginitis (gardnerella): NEGATIVE
Candida Glabrata: NEGATIVE
Candida Vaginitis: NEGATIVE
Chlamydia: NEGATIVE
Comment: NEGATIVE
Comment: NEGATIVE
Comment: NEGATIVE
Comment: NEGATIVE
Comment: NEGATIVE
Comment: NORMAL
Neisseria Gonorrhea: NEGATIVE
Trichomonas: NEGATIVE

## 2019-09-25 IMAGING — US US OB < 14 WEEKS - US OB TV
1 series · 15 of 28 positions shown · non-contrast
Comparison: None for this gestation

CLINICAL DATA: Vaginal bleeding in first trimester pregnancy

EXAM:
OBSTETRIC <14 WK US AND TRANSVAGINAL OB US
TECHNIQUE: Both transabdominal and transvaginal ultrasound examinations were
performed for complete evaluation of the gestation as well as the
maternal uterus, adnexal regions, and pelvic cul-de-sac.
Transvaginal technique was performed to assess early pregnancy.

[Series 1: us ob < 14 weeks - us ob tv · 37 acquisitions, 15 frames shown]
[im 1/37]
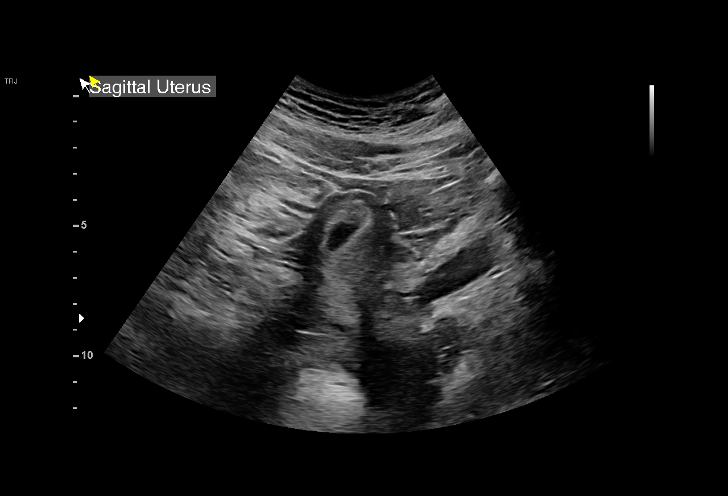
[im 3/37]
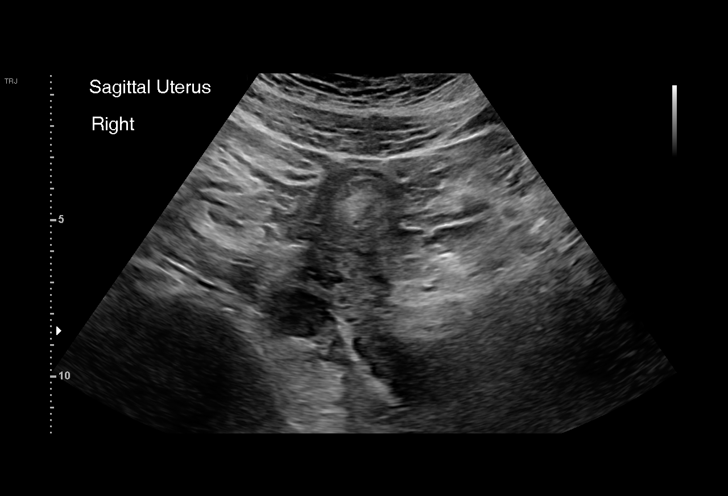
[im 6/37]
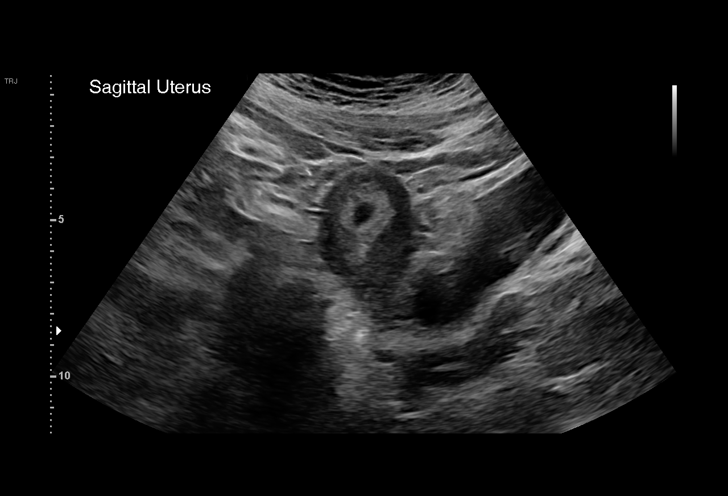
[im 9/37]
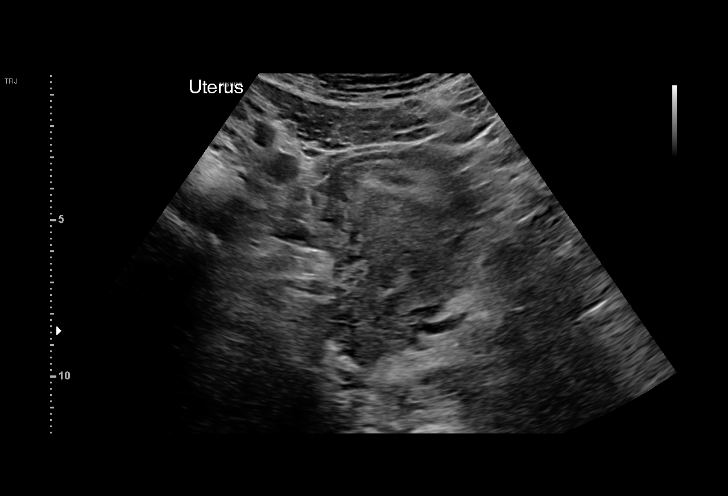
[im 11/37]
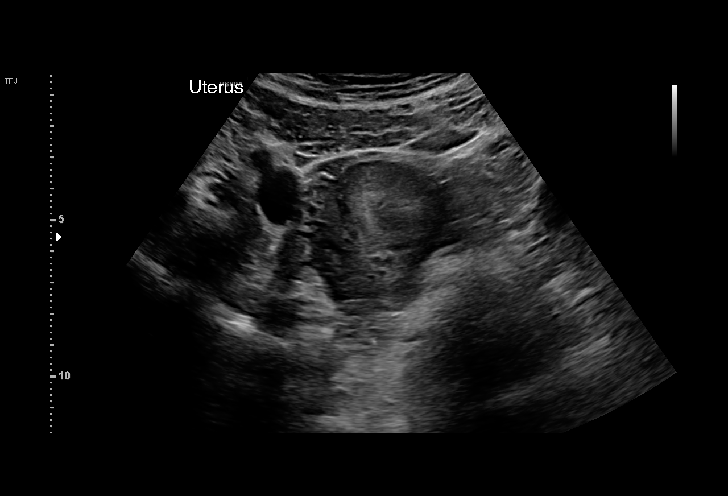
[im 14/37]
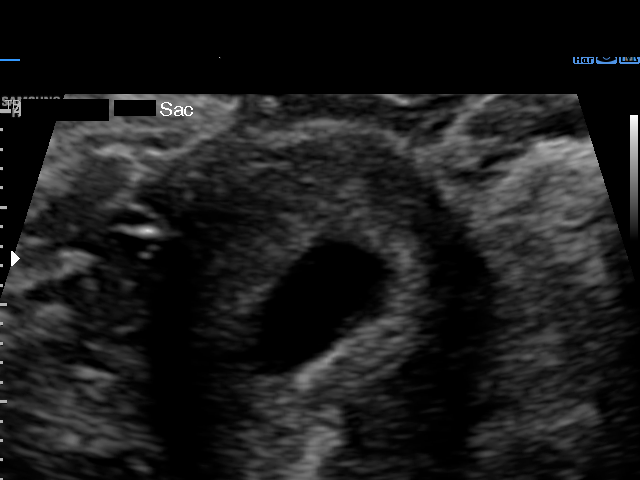
[im 17/37]
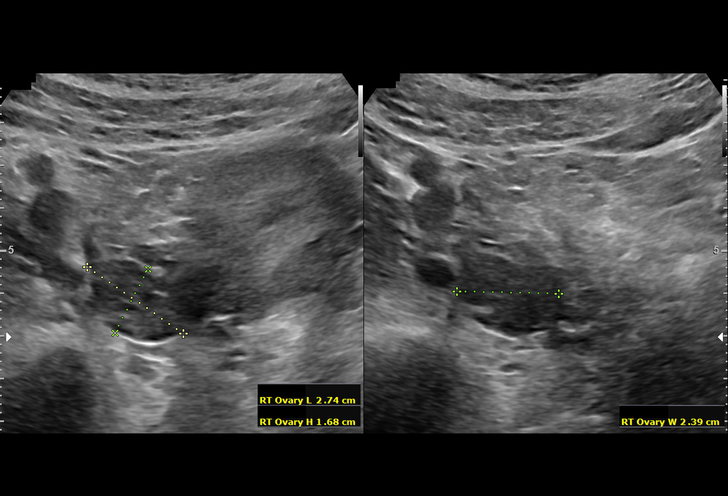
[im 19/37]
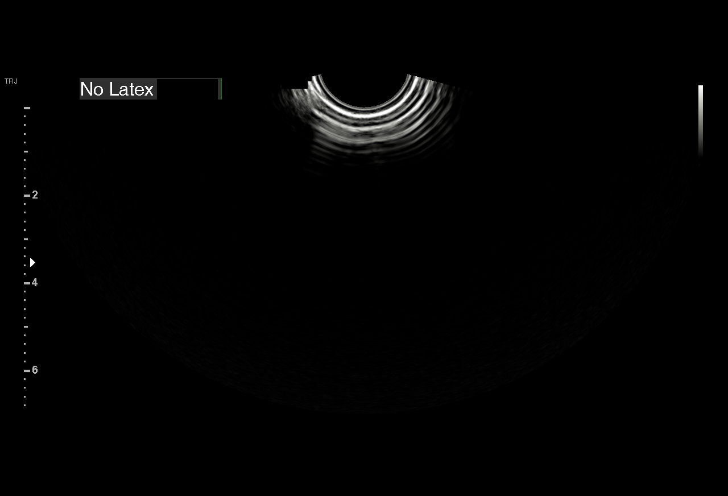
[im 21/37]
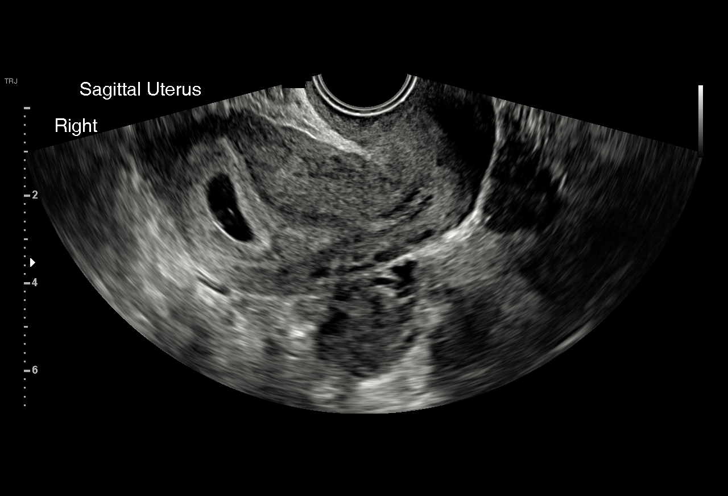
[im 23/37]
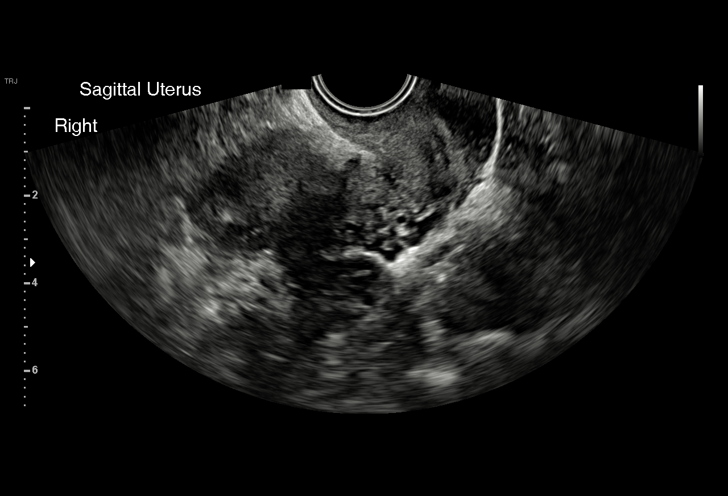
[im 26/37]
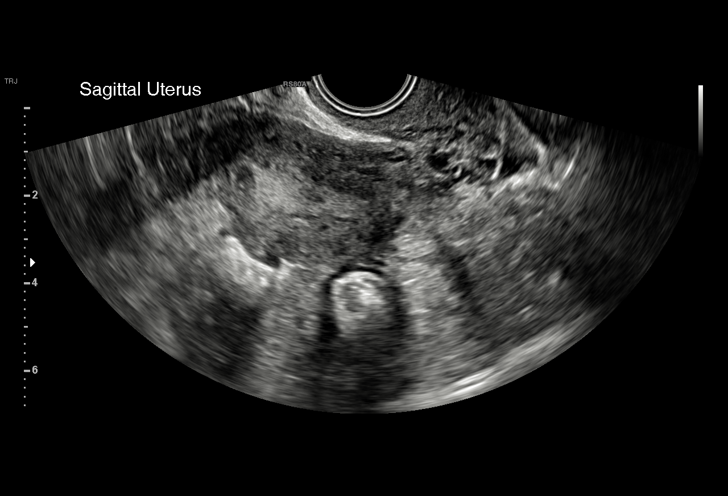
[im 29/37]
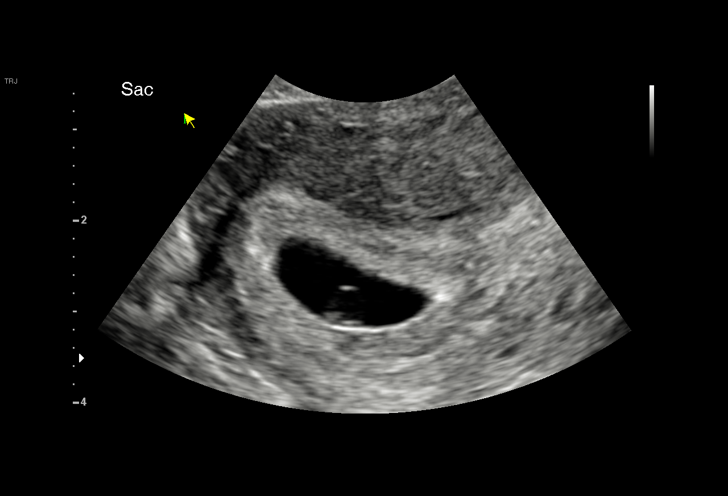
[im 31/37]
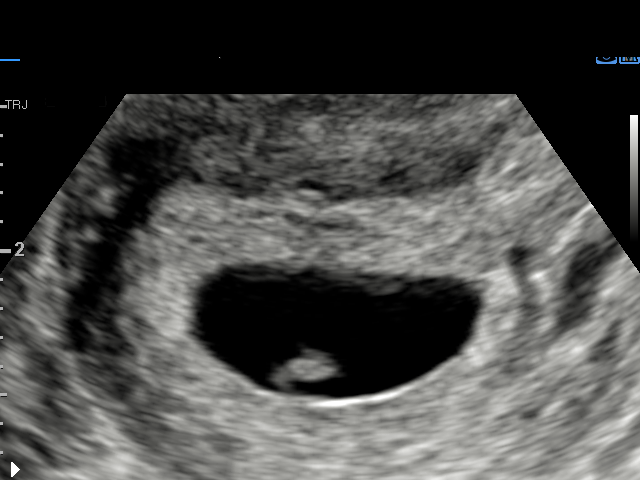
[im 34/37]
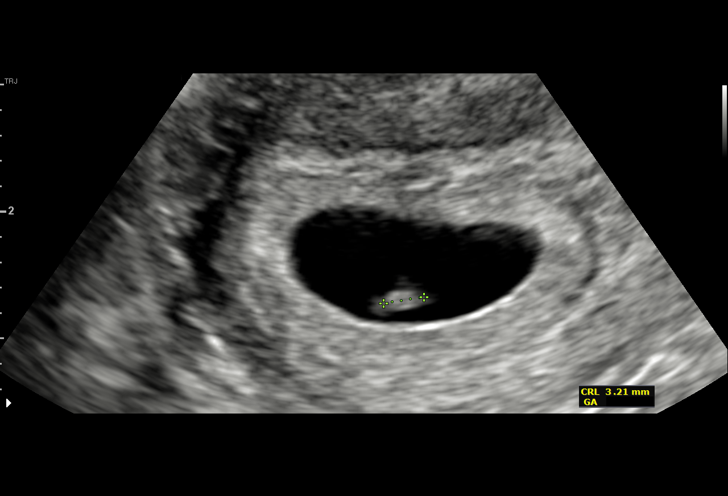
[im 37/37]
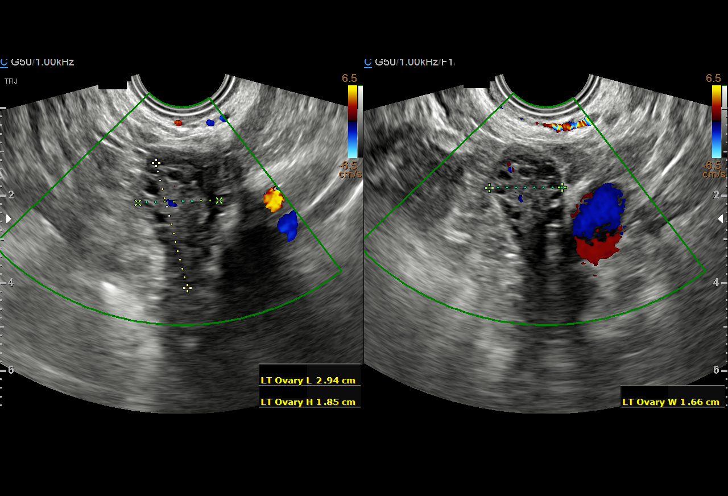

[15 of 28 positions shown; findings below may reference images not displayed]

FINDINGS: Intrauterine gestational sac: Present, single

Yolk sac:  Present

Embryo:  Present

Cardiac Activity: Present

Heart Rate: 101 bpm

CRL:  3.4 mm   5 w   6 d                  US EDC: 11/30/2017

Subchorionic hemorrhage:  Probable small subchronic hemorrhage

Maternal uterus/adnexae:

RIGHT ovary measures 3.7 x 2.3 x 2.9 cm and contains a small
hemorrhagic corpus luteum.

LEFT ovary normal size and morphology, 2.9 x 1.3 x 2.3 cm.

No free pelvic fluid or adnexal masses.
IMPRESSION: Single live intrauterine gestation at 5 weeks 6 days EGA by
crown-rump length.

Probable small subchronic hemorrhage.

## 2019-12-16 ENCOUNTER — Encounter (HOSPITAL_COMMUNITY): Payer: Self-pay | Admitting: *Deleted

## 2019-12-16 ENCOUNTER — Ambulatory Visit (HOSPITAL_COMMUNITY)
Admission: EM | Admit: 2019-12-16 | Discharge: 2019-12-16 | Disposition: A | Discharge status: Home / Self Care | Payer: Medicaid Other | Attending: Physician Assistant | Admitting: Physician Assistant

## 2019-12-16 ENCOUNTER — Other Ambulatory Visit: Payer: Self-pay

## 2019-12-16 DIAGNOSIS — M722 Plantar fascial fibromatosis: Secondary | ICD-10-CM

## 2019-12-16 MED ORDER — MELOXICAM 7.5 MG PO TABS: 7.5000 mg | ORAL_TABLET | Freq: Every day | ORAL | 0 refills | Status: DC

## 2019-12-16 NOTE — ED Triage Notes (Signed)
Pt reports experiencing sudden onset sharp pain on top of right foot 2 days ago while cleaning.  C/O pain radiating into right ankle and through to bottom of right foot.  RLE CMS intact.

## 2019-12-16 NOTE — Discharge Instructions (Addendum)
Start Mobic. Do not take ibuprofen (motrin/advil)/ naproxen (aleve) while on mobic. Ice compress, stretches as discussed. Cam walker as needed for support. Follow up with PCP/orthopedics if symptoms not improving.
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
## 2020-01-06 ENCOUNTER — Telehealth: Payer: Medicaid Other | Admitting: Nurse Practitioner

## 2020-01-06 DIAGNOSIS — M79673 Pain in unspecified foot: Secondary | ICD-10-CM

## 2020-01-06 NOTE — Progress Notes (Signed)
Based on what you shared with me it looks like you have foot pain,that should be evaluated in a face to face office visit. soinds like you need to see an orthopedist for proper treatment. Also we cannot do pain meds in an evisit.    NOTE: If you entered your credit card information for this eVisit, you will not be charged. You may see a "hold" on your card for the $35 but that hold will drop off and you will not have a charge processed.  If you are having a true medical emergency please call 911.     For an urgent face to face visit, [place] has four urgent care centers for your convenience:   . [facility]    [phone]                  Get Driving Directions  [address] [place], [place] [address] . 10 am to 8 pm Monday-Friday . 12 pm to 8 pm Saturday-Sunday   . [facility] at [facility]  [phone]                  Get Driving Directions  [address] [place], [place] [address] . 8 am to 8 pm Monday-Friday . 9 am to 6 pm Saturday . 11 am to 6 pm Sunday   . [facility] at [facility]  [phone]                  Get Driving Directions   [address] [place], [place] [address] . 8 am to 8 pm Monday-Friday . 8 am to 4 pm Saturday-Sunday    . [facility] at [facility] Directions  [phone]  [address] [place], [place] [address]  . Monday-Friday, 12 PM to 6 PM    Your e-visit answers were reviewed by a board certified advanced clinical practitioner to complete your personal care plan.  Thank you for using e-Visits.

## 2020-01-29 ENCOUNTER — Other Ambulatory Visit: Payer: Medicaid Other

## 2020-07-14 ENCOUNTER — Ambulatory Visit (HOSPITAL_COMMUNITY)
Admission: EM | Admit: 2020-07-14 | Discharge: 2020-07-14 | Disposition: A | Discharge status: Home / Self Care | Payer: Medicaid Other | Attending: Emergency Medicine | Admitting: Emergency Medicine

## 2020-07-14 ENCOUNTER — Other Ambulatory Visit: Payer: Self-pay

## 2020-07-14 ENCOUNTER — Encounter (HOSPITAL_COMMUNITY): Payer: Self-pay | Admitting: *Deleted

## 2020-07-14 DIAGNOSIS — N898 Other specified noninflammatory disorders of vagina: Secondary | ICD-10-CM

## 2020-07-14 LAB — POCT URINALYSIS DIPSTICK, ED / UC
Bilirubin Urine: NEGATIVE
Glucose, UA: NEGATIVE mg/dL
Hgb urine dipstick: NEGATIVE
Nitrite: NEGATIVE
Protein, ur: NEGATIVE mg/dL
Specific Gravity, Urine: 1.025 (ref 1.005–1.030)
Urobilinogen, UA: 1 mg/dL (ref 0.0–1.0)
pH: 7 (ref 5.0–8.0)

## 2020-07-14 NOTE — Discharge Instructions (Addendum)
Labs pending 2-3 days, you will be contacted if positive for any sti and treatment will be sent to the pharmacy, you will have to return to the clinic if positive for gonorrhea to receive treatment   Your urinalysis today showed small Joy [name] blood cells to help fight infection, this possibly related to a possible sexually transmitted infection  Please refrain from having sex until labs results, if positive please refrain from having sex until treatment complete and symptoms resolve   If positive for HIV, Syphilis, Chlamydia  gonorrhea or trichomoniasis please notify partner or partners so they may tested as well  Moving forward, it is recommended you use some form of protection against the transmission of sti infections  such as condoms or dental dams with each sexual encounter
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
## 2020-07-15 LAB — CERVICOVAGINAL ANCILLARY ONLY
Bacterial Vaginitis (gardnerella): POSITIVE — AB
Candida Glabrata: NEGATIVE
Candida Vaginitis: NEGATIVE
Chlamydia: NEGATIVE
Comment: NEGATIVE
Comment: NEGATIVE
Comment: NEGATIVE
Comment: NEGATIVE
Comment: NEGATIVE
Comment: NORMAL
Neisseria Gonorrhea: NEGATIVE
Trichomonas: POSITIVE — AB

## 2020-07-16 ENCOUNTER — Telehealth: Payer: Self-pay | Admitting: Emergency Medicine

## 2020-07-16 MED ORDER — METRONIDAZOLE 500 MG PO TABS: 500.0000 mg | ORAL_TABLET | Freq: Two times a day (BID) | ORAL | 0 refills | Status: AC

## 2020-07-16 MED ORDER — FLUCONAZOLE 150 MG PO TABS: 150.0000 mg | ORAL_TABLET | Freq: Once | ORAL | 0 refills | Status: AC

## 2020-07-16 NOTE — Telephone Encounter (Signed)
Patient positive for trichomonas and BV, will treat with metronidazole twice daily x1 week.

## 2020-10-07 ENCOUNTER — Encounter (HOSPITAL_COMMUNITY): Payer: Self-pay

## 2020-10-07 ENCOUNTER — Other Ambulatory Visit: Payer: Self-pay

## 2020-10-07 ENCOUNTER — Ambulatory Visit (HOSPITAL_COMMUNITY)
Admission: EM | Admit: 2020-10-07 | Discharge: 2020-10-07 | Disposition: A | Discharge status: Home / Self Care | Payer: Medicaid Other | Attending: Physician Assistant | Admitting: Physician Assistant

## 2020-10-07 ENCOUNTER — Ambulatory Visit (INDEPENDENT_AMBULATORY_CARE_PROVIDER_SITE_OTHER): Payer: Medicaid Other

## 2020-10-07 DIAGNOSIS — J40 Bronchitis, not specified as acute or chronic: Secondary | ICD-10-CM

## 2020-10-07 DIAGNOSIS — R052 Subacute cough: Secondary | ICD-10-CM

## 2020-10-07 DIAGNOSIS — R059 Cough, unspecified: Secondary | ICD-10-CM | POA: Diagnosis not present

## 2020-10-07 DIAGNOSIS — J329 Chronic sinusitis, unspecified: Secondary | ICD-10-CM | POA: Diagnosis not present

## 2020-10-07 MED ORDER — PREDNISONE 10 MG PO TABS: 20.0000 mg | ORAL_TABLET | Freq: Every day | ORAL | 0 refills | Status: AC

## 2020-10-07 MED ORDER — AMOXICILLIN-POT CLAVULANATE 875-125 MG PO TABS: 1.0000 | ORAL_TABLET | Freq: Two times a day (BID) | ORAL | 0 refills | Status: DC

## 2020-10-07 NOTE — ED Triage Notes (Signed)
Pt reports headache, congestion and cough x 5 weeks after being exposed to COVID at work. Pt had a negative COVID test 3 days ago.

## 2020-10-07 NOTE — Discharge Instructions (Addendum)
Your chest x-ray was normal which is great news.  I believe that you have a sinus infection that is leading to bronchitis.  Please start Augmentin twice daily for 7 days.  I have also called in prednisone to help with your symptoms.  Please take 20 mg in the morning for 4 days.  Do not take NSAIDs including aspirin, ibuprofen/Advil, naproxen/Aleve with this medication as an cause stomach bleeding.  You can use Tylenol and multisymptom medication for fever and pain relief.  If you have any worsening symptoms please return for reevaluation as we discussed.
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
## 2021-04-19 ENCOUNTER — Encounter (HOSPITAL_COMMUNITY): Payer: Self-pay

## 2021-04-19 ENCOUNTER — Ambulatory Visit (HOSPITAL_COMMUNITY)
Admission: RE | Admit: 2021-04-19 | Discharge: 2021-04-19 | Disposition: A | Discharge status: Home / Self Care | Payer: Medicaid Other | Source: Ambulatory Visit | Attending: Nurse Practitioner | Admitting: Nurse Practitioner

## 2021-04-19 VITALS — BP 113/79 | HR 79 | Temp 98.4°F | Resp 18

## 2021-04-19 DIAGNOSIS — L738 Other specified follicular disorders: Secondary | ICD-10-CM

## 2021-04-19 MED ORDER — DOXYCYCLINE HYCLATE 100 MG PO TABS: 100.0000 mg | ORAL_TABLET | Freq: Two times a day (BID) | ORAL | 0 refills | Status: AC

## 2021-04-19 NOTE — ED Triage Notes (Signed)
Pt reports for about week has knot at panty line. Tried popping but denies any drainage.  ?

## 2021-04-19 NOTE — Discharge Instructions (Addendum)
Take the medication as prescribed. ?Keep the area clean and dry.  As discussed, recommend using an antibacterial soap such as Dial Gold bar, which she can purchase over-the-counter, to keep the area clean and dry. ?Continue warm salt water soaks and warm compresses. ?May take over-the-counter ibuprofen or Tylenol for pain, fever, or general discomfort. ?Follow-up if symptoms do not improve. ?
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
## 2021-05-03 ENCOUNTER — Encounter (HOSPITAL_COMMUNITY): Payer: Self-pay

## 2021-05-03 ENCOUNTER — Ambulatory Visit (HOSPITAL_COMMUNITY)
Admission: RE | Admit: 2021-05-03 | Discharge: 2021-05-03 | Disposition: A | Discharge status: Home / Self Care | Payer: Medicaid Other | Source: Ambulatory Visit | Attending: Emergency Medicine | Admitting: Emergency Medicine

## 2021-05-03 VITALS — BP 104/61 | HR 63 | Temp 98.1°F | Resp 16 | Ht 67.0 in | Wt 250.0 lb

## 2021-05-03 DIAGNOSIS — N760 Acute vaginitis: Secondary | ICD-10-CM

## 2021-05-03 DIAGNOSIS — Z113 Encounter for screening for infections with a predominantly sexual mode of transmission: Secondary | ICD-10-CM | POA: Diagnosis present

## 2021-05-03 DIAGNOSIS — N898 Other specified noninflammatory disorders of vagina: Secondary | ICD-10-CM | POA: Diagnosis present

## 2021-05-03 LAB — POCT URINALYSIS DIPSTICK, ED / UC
Bilirubin Urine: NEGATIVE
Glucose, UA: NEGATIVE mg/dL
Hgb urine dipstick: NEGATIVE
Ketones, ur: NEGATIVE mg/dL
Leukocytes,Ua: NEGATIVE
Nitrite: NEGATIVE
Protein, ur: NEGATIVE mg/dL
Specific Gravity, Urine: 1.025 (ref 1.005–1.030)
Urobilinogen, UA: 1 mg/dL (ref 0.0–1.0)
pH: 6 (ref 5.0–8.0)

## 2021-05-03 LAB — POC URINE PREG, ED: Preg Test, Ur: NEGATIVE

## 2021-05-03 MED ORDER — FLUCONAZOLE 150 MG PO TABS: ORAL_TABLET | ORAL | 0 refills | Status: DC

## 2021-05-03 MED ORDER — METRONIDAZOLE 0.75 % VA GEL: VAGINAL | 0 refills | Status: DC

## 2021-05-03 NOTE — ED Triage Notes (Signed)
Pt reports vaginal itching and odor x 4 days. Requesting STD testing. ?
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
## 2021-05-03 NOTE — Discharge Instructions (Addendum)
Your urinalysis today was normal.  Your urine pregnancy test was negative. ? ?Based on the history that you provided to me today, you were treated empirically for bacterial vaginitis with metronidazole gel, please insert 1 applicatorful once daily at bedtime for the next 5 nights.  Please abstain from sexual intercourse, tampon use while being treated. ?  ?Based on the history that you provided to me today, you were treated empirically for vaginal candidiasis with fluconazole (Diflucan), take the first tablet today and take the second tablet three days after the first tablet.  Please abstain from sexual intercourse, tampon use while being treated. ?  ?The results of your STD testing today will be made available to you once they are complete, this typically takes 3 to 5 days.  They will initially be posted to your MyChart and, if any of your results are abnormal, you will receive a phone call with those results along with further instructions regarding any further treatment, if needed.  ?  ?If you have not had complete resolution of your symptoms after completing treatment, please return for repeat evaluation. ?  ?Thank you for visiting urgent care today.  I appreciate the opportunity to participate in your care. ? ?

## 2021-05-06 LAB — CERVICOVAGINAL ANCILLARY ONLY
Bacterial Vaginitis (gardnerella): NEGATIVE
Candida Glabrata: NEGATIVE
Candida Vaginitis: NEGATIVE
Chlamydia: NEGATIVE
Comment: NEGATIVE
Comment: NEGATIVE
Comment: NEGATIVE
Comment: NEGATIVE
Comment: NEGATIVE
Comment: NORMAL
Neisseria Gonorrhea: NEGATIVE
Trichomonas: NEGATIVE

## 2021-05-15 ENCOUNTER — Encounter (HOSPITAL_COMMUNITY): Payer: Self-pay

## 2021-05-15 ENCOUNTER — Ambulatory Visit (HOSPITAL_COMMUNITY)
Admission: RE | Admit: 2021-05-15 | Discharge: 2021-05-15 | Disposition: A | Discharge status: Home / Self Care | Payer: Medicaid Other | Source: Ambulatory Visit | Attending: Internal Medicine | Admitting: Internal Medicine

## 2021-05-15 VITALS — BP 124/82 | HR 61 | Temp 98.1°F | Resp 18

## 2021-05-15 DIAGNOSIS — H109 Unspecified conjunctivitis: Secondary | ICD-10-CM

## 2021-05-15 MED ORDER — OFLOXACIN 0.3 % OP SOLN: 1.0000 [drp] | Freq: Four times a day (QID) | OPHTHALMIC | 0 refills | Status: DC

## 2021-05-15 NOTE — Discharge Instructions (Signed)
It was that you have an infection in your eye.  Please use drops 4 times daily to treat bacterial infection.  Use warm compress to keep your eye clean.  Apply lubricating eyedrops/artificial tears for symptom relief.  If you have any worsening symptoms you need to be seen immediately for reevaluation including headache, nausea, vomiting, dizziness, foreign body sensation in your eye.  I do recommend that you follow-up with an eye specialist even if your symptoms resolved to address your vision.  Please call to schedule an appointment with them soon as possible. ?

## 2021-05-15 NOTE — ED Triage Notes (Signed)
Pt c/o rt eye redness, itchy, and drainage since yesterday.  ?
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
## 2021-07-06 ENCOUNTER — Ambulatory Visit: Payer: Medicaid Other

## 2021-07-16 ENCOUNTER — Ambulatory Visit: Payer: Medicaid Other

## 2021-09-07 ENCOUNTER — Ambulatory Visit (HOSPITAL_COMMUNITY): Payer: Medicaid Other

## 2021-09-10 ENCOUNTER — Encounter (HOSPITAL_COMMUNITY): Payer: Self-pay

## 2021-09-10 ENCOUNTER — Ambulatory Visit (HOSPITAL_COMMUNITY)
Admission: EM | Admit: 2021-09-10 | Discharge: 2021-09-10 | Disposition: A | Discharge status: Home / Self Care | Payer: Medicaid Other | Attending: Emergency Medicine | Admitting: Emergency Medicine

## 2021-09-10 DIAGNOSIS — B349 Viral infection, unspecified: Secondary | ICD-10-CM

## 2021-09-10 DIAGNOSIS — Z20822 Contact with and (suspected) exposure to covid-19: Secondary | ICD-10-CM | POA: Diagnosis not present

## 2021-09-10 LAB — RESP PANEL BY RT-PCR (FLU A&B, COVID) ARPGX2
Influenza A by PCR: NEGATIVE
Influenza B by PCR: NEGATIVE
SARS Coronavirus 2 by RT PCR: NEGATIVE

## 2021-09-10 MED ORDER — KETOROLAC TROMETHAMINE 30 MG/ML IJ SOLN
INTRAMUSCULAR | Status: AC
  Filled 2021-09-10: qty 1

## 2021-09-10 MED ORDER — ACETAMINOPHEN 325 MG PO TABS
975.0000 mg | ORAL_TABLET | Freq: Once | ORAL | Status: AC
  Administered 2021-09-10: 975 mg via ORAL

## 2021-09-10 MED ORDER — KETOROLAC TROMETHAMINE 30 MG/ML IJ SOLN
30.0000 mg | Freq: Once | INTRAMUSCULAR | Status: AC
  Administered 2021-09-10: 30 mg via INTRAMUSCULAR

## 2021-09-10 MED ORDER — ACETAMINOPHEN 325 MG PO TABS
ORAL_TABLET | ORAL | Status: AC
  Filled 2021-09-10: qty 3
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
## 2021-09-10 NOTE — ED Triage Notes (Signed)
Pt is here for headaches, possible sinus infection, sinus pain in the face x2days. Pt states head feels like someone is squeezing her head

## 2021-09-10 NOTE — Discharge Instructions (Signed)
Your symptoms and physical exam findings are concerning for a viral respiratory infection.    The result of your viral PCR testing will be posted to your MyChart once it is complete, typically this takes 6 to 12 hours.  If there is a positive result, you will be notified by phone and provided with further recommendations.  Please see the list below for recommended medications, dosages and frequencies to provide relief of your current symptoms:     Atrovent (ipratropium): This is an excellent nasal decongestant spray that does not cause rebound congestion, please instill 2 sprays into each nare with each use.  Please use the spray up to 4 times daily as needed.  I have provided you with a prescription for this medication.      Advil, Motrin (ibuprofen): This is a good anti-inflammatory medication which addresses aches, pains and inflammation of the upper airways that causes sinus and nasal congestion as well as in the lower airways which makes your cough feel tight and sometimes burn.  I recommend that you take between 400 to 600 mg every 6-8 hours as needed.      Tylenol (acetaminophen): This is a good fever reducer.  If your body temperature rises above 101.5 as measured with a thermometer, it is recommended that you take 1,000 mg every 8 hours until your temperature falls below 101.5, please not take more than 3,000 mg of acetaminophen either as a separate medication or as in ingredient in an over-the-counter cold/flu preparation within a 24-hour period.     Please follow-up within the next 5-7 days either with your primary care provider or urgent care if your symptoms do not resolve.  If you do not have a primary care provider, we will assist you in finding one.   Thank you for visiting urgent care today.  We appreciate the opportunity to participate in your care.

## 2021-09-26 ENCOUNTER — Ambulatory Visit (HOSPITAL_COMMUNITY)
Admission: EM | Admit: 2021-09-26 | Discharge: 2021-09-26 | Disposition: A | Discharge status: Home / Self Care | Payer: Medicaid Other | Attending: Family Medicine | Admitting: Family Medicine

## 2021-09-26 ENCOUNTER — Encounter (HOSPITAL_COMMUNITY): Payer: Self-pay

## 2021-09-26 DIAGNOSIS — T148XXA Other injury of unspecified body region, initial encounter: Secondary | ICD-10-CM

## 2021-09-26 DIAGNOSIS — M549 Dorsalgia, unspecified: Secondary | ICD-10-CM | POA: Diagnosis not present

## 2021-09-26 DIAGNOSIS — M25511 Pain in right shoulder: Secondary | ICD-10-CM

## 2021-09-26 MED ORDER — IBUPROFEN 600 MG PO TABS: 600.0000 mg | ORAL_TABLET | Freq: Four times a day (QID) | ORAL | 0 refills | Status: DC | PRN

## 2021-09-26 MED ORDER — TIZANIDINE HCL 4 MG PO TABS
4.0000 mg | ORAL_TABLET | Freq: Four times a day (QID) | ORAL | 0 refills | Status: DC | PRN
Start: 2021-09-26 — End: 2022-03-28

## 2021-09-26 NOTE — ED Triage Notes (Signed)
Pt is here for right shoulder pain  x2days

## 2021-09-26 NOTE — Discharge Instructions (Addendum)
You were seen today for back, neck and shoulder pain.  This appears to be muscular in nature.    I have sent out a muscle relaxer and motrin.  The muscle relaxer can make you tired/sleepy so please take when home and not driving.  You may also use a heating pad/hot shower, along with rest.  If you continue with symptoms please return, or see your primary care provider.
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
## 2021-12-03 ENCOUNTER — Ambulatory Visit (HOSPITAL_COMMUNITY)
Admission: EM | Admit: 2021-12-03 | Discharge: 2021-12-03 | Disposition: A | Discharge status: Home / Self Care | Payer: Medicaid Other | Attending: Internal Medicine | Admitting: Internal Medicine

## 2021-12-03 ENCOUNTER — Encounter (HOSPITAL_COMMUNITY): Payer: Self-pay | Admitting: *Deleted

## 2021-12-03 ENCOUNTER — Other Ambulatory Visit: Payer: Self-pay

## 2021-12-03 DIAGNOSIS — Z1152 Encounter for screening for COVID-19: Secondary | ICD-10-CM | POA: Insufficient documentation

## 2021-12-03 DIAGNOSIS — A084 Viral intestinal infection, unspecified: Secondary | ICD-10-CM | POA: Insufficient documentation

## 2021-12-03 LAB — RESP PANEL BY RT-PCR (FLU A&B, COVID) ARPGX2
Influenza A by PCR: NEGATIVE
Influenza B by PCR: NEGATIVE
SARS Coronavirus 2 by RT PCR: NEGATIVE

## 2021-12-03 NOTE — ED Provider Notes (Signed)
[facility]   [id number] 12/03/21 Arrival Time: 0803  ASSESSMENT & PLAN:  1. Viral enteritis    -Patient's history and exam are consistent with a viral enteritis, likely from her son.  Since she has bodyaches and exposure to other children, I tested her for flu and COVID today.  We will call her with results.  I discussed supportive treatment including over-the-counter flu and cold medications, ibuprofen and Tylenol for aches, and recommended vigorous p.o. hydration and hand hygiene.  All questions were answered and she agrees to plan.  Work note given.  No orders of the defined types were placed in this encounter.      Reviewed expectations re: course of current medical issues. Questions answered. Outlined signs and symptoms indicating need for more acute intervention. Patient verbalized understanding. After Visit Summary given.   SUBJECTIVE: Pleasant 31 year old female comes urgent care to be evaluated for abdominal pain and diarrhea and bodyaches.  Symptoms been present for about 24 hours.  She has a 68-year-old son who she brings with her to urgent care who has been vomiting.  She describes her symptoms as as abdominal pain, acid reflux, and diarrhea.  No vomiting for her.  She also describes back aches and full body aches.  No fever.  No chest pain or shortness of breath.  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  She has not had her COVID shots or flu shot this year.  Patient's last menstrual period was 11/26/2021. Past Surgical History:  Procedure Laterality Date   WISDOM TOOTH EXTRACTION       OBJECTIVE:  Vitals:   12/03/21 0839  BP: 120/71  Pulse: 62  Resp: 18  Temp: 98.7 F (37.1 C)  SpO2: 100%     Physical Exam Vitals reviewed.  Constitutional:      Appearance: She is well-developed. She is not ill-appearing.  HENT:     Head: Normocephalic.     Mouth/Throat:      Pharynx: No posterior oropharyngeal erythema.  Cardiovascular:     Rate and Rhythm: Normal rate.  Pulmonary:     Effort: Pulmonary effort is normal.  Abdominal:     General: Bowel sounds are normal.     Palpations: Abdomen is soft.     Tenderness: There is no abdominal tenderness.  Lymphadenopathy:     Cervical: No cervical adenopathy.  Skin:    General: Skin is warm.      Labs: Results for orders placed or performed during the hospital encounter of 09/10/21  Resp Panel by RT-PCR (Flu A&B, Covid) Anterior Nasal Swab   Specimen: Anterior Nasal Swab  Result Value Ref Range   SARS Coronavirus 2 by RT PCR NEGATIVE NEGATIVE   Influenza A by PCR NEGATIVE NEGATIVE   Influenza B by PCR NEGATIVE NEGATIVE   Labs Reviewed  RESP PANEL BY RT-PCR (FLU A&B, COVID) [id number]    Imaging: No results found.   No Known Allergies                                             Past Medical History:  Diagnosis Date   BV (bacterial vaginosis)    Genital herpes     Social History   Socioeconomic History   Marital status: Single    Spouse name:  Not on file   Number of children: Not on file   Years of education: Not on file   Highest education level: Not on file  Occupational History   Not on file  Tobacco Use   Smoking status: Former    Types: Cigarettes    Quit date: 11/19/2013    Years since quitting: 8.0   Smokeless tobacco: Never  Vaping Use   Vaping Use: Never used  Substance and Sexual Activity   Alcohol use: Yes    Comment: occasionally   Drug use: Yes    Types: Marijuana    Comment: occasionally   Sexual activity: Not Currently    Birth control/protection: None  Other Topics Concern   Not on file  Social History Narrative   Not on file   Social Determinants of Health   Financial Resource Strain: Low Risk  (11/30/2017)   Overall Financial Resource Strain (CARDIA)    Difficulty of Paying Living Expenses: Not hard at all  Food Insecurity: No Food Insecurity  (11/30/2017)   Hunger Vital Sign    Worried About Running Out of Food in the Last Year: Never true    Ran Out of Food in the Last Year: Never true  Transportation Needs: Unknown (01/11/2018)   PRAPARE - Administrator, Civil Service (Medical): Not on file    Lack of Transportation (Non-Medical): No  Physical Activity: Not on file  Stress: No Stress Concern Present (11/30/2017)   [employer] of Occupational Health - Occupational Stress Questionnaire    Feeling of Stress : Not at all  Social Connections: Not on file  Intimate Partner Violence: Unknown (11/30/2017)   Humiliation, Afraid, Rape, and Kick questionnaire    Fear of Current or Ex-Partner: No    Emotionally Abused: No    Physically Abused: No    Sexually Abused: Not on file    Family History  Problem Relation Age of Onset   Healthy Mother    Healthy Father       Armilda Vanderlinden, Baldemar Friday, MD 12/03/21 [phone]

## 2021-12-03 NOTE — Discharge Instructions (Addendum)

## 2021-12-03 NOTE — ED Triage Notes (Signed)
Pt reports Vomiting,HA,Back pain ABD pain started yesterday.

## 2022-02-23 ENCOUNTER — Ambulatory Visit
Admission: EM | Admit: 2022-02-23 | Discharge: 2022-02-23 | Disposition: A | Discharge status: Home / Self Care | Payer: Medicaid Other | Attending: Nurse Practitioner | Admitting: Nurse Practitioner

## 2022-02-23 DIAGNOSIS — R051 Acute cough: Secondary | ICD-10-CM

## 2022-02-23 DIAGNOSIS — Z20822 Contact with and (suspected) exposure to covid-19: Secondary | ICD-10-CM

## 2022-02-23 MED ORDER — PROMETHAZINE-DM 6.25-15 MG/5ML PO SYRP: 5.0000 mL | ORAL_SOLUTION | Freq: Four times a day (QID) | ORAL | 0 refills | Status: DC | PRN

## 2022-02-23 NOTE — ED Triage Notes (Signed)
Pt reports cough and congest\ion x 2 days. Pt has not taken any medication to help with her symptoms.

## 2022-02-23 NOTE — Discharge Instructions (Signed)
Clinical contact you with results of your COVID testing if positive Promethazine DM for cough.  Patient's medication can make you drowsy.  Do not drink alcohol or drive while on this medication Rest and fluids Follow-up with your PCP in 2 days for recheck Please go to the emergency room for any worsening symptoms
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
## 2022-02-24 LAB — SARS CORONAVIRUS 2 (TAT 6-24 HRS): SARS Coronavirus 2: NEGATIVE

## 2022-03-15 ENCOUNTER — Encounter (HOSPITAL_COMMUNITY): Payer: Self-pay

## 2022-03-15 ENCOUNTER — Other Ambulatory Visit: Payer: Self-pay

## 2022-03-15 ENCOUNTER — Ambulatory Visit (HOSPITAL_COMMUNITY)
Admission: RE | Admit: 2022-03-15 | Discharge: 2022-03-15 | Disposition: A | Discharge status: Home / Self Care | Payer: Medicaid Other | Source: Ambulatory Visit | Attending: Nurse Practitioner | Admitting: Nurse Practitioner

## 2022-03-15 VITALS — BP 117/76 | HR 58 | Temp 98.6°F | Resp 18 | Ht 67.0 in | Wt 249.0 lb

## 2022-03-15 DIAGNOSIS — N764 Abscess of vulva: Secondary | ICD-10-CM | POA: Diagnosis not present

## 2022-03-15 DIAGNOSIS — L0291 Cutaneous abscess, unspecified: Secondary | ICD-10-CM

## 2022-03-15 MED ORDER — SULFAMETHOXAZOLE-TRIMETHOPRIM 800-160 MG PO TABS
1.0000 | ORAL_TABLET | Freq: Two times a day (BID) | ORAL | 0 refills | Status: AC
Start: 2022-03-15 — End: 2022-03-22

## 2022-03-15 NOTE — Discharge Instructions (Addendum)
A skin abscess is an infected spot of skin. It sometimes requites to be drained. Your abscess does not need to be drained today.  Take antibiotics as prescribed  Put a heat pack or a warm, wet washcloth on the spot. Do not mash or manipulate the wound Check your abscess every day for signs that the infection is getting worse. Check for: More redness, swelling, or pain. More fluid or blood. Warmth. More pus or a worse smell.

## 2022-03-15 NOTE — ED Notes (Signed)
At bedside for examination of ?abscess

## 2022-03-15 NOTE — ED Triage Notes (Signed)
Pt c/o of having an abscess on her left groin for 2 days, pt wants to know if it needs to be drain.
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
## 2022-03-18 ENCOUNTER — Ambulatory Visit (HOSPITAL_COMMUNITY): Payer: Self-pay

## 2022-03-28 ENCOUNTER — Ambulatory Visit (HOSPITAL_COMMUNITY)
Admission: EM | Admit: 2022-03-28 | Discharge: 2022-03-28 | Disposition: A | Discharge status: Home / Self Care | Payer: Medicaid Other | Attending: Emergency Medicine | Admitting: Emergency Medicine

## 2022-03-28 ENCOUNTER — Encounter (HOSPITAL_COMMUNITY): Payer: Self-pay

## 2022-03-28 DIAGNOSIS — M545 Low back pain, unspecified: Secondary | ICD-10-CM | POA: Diagnosis not present

## 2022-03-28 DIAGNOSIS — S80812A Abrasion, left lower leg, initial encounter: Secondary | ICD-10-CM | POA: Diagnosis not present

## 2022-03-28 DIAGNOSIS — Z23 Encounter for immunization: Secondary | ICD-10-CM

## 2022-03-28 DIAGNOSIS — W108XXA Fall (on) (from) other stairs and steps, initial encounter: Secondary | ICD-10-CM | POA: Diagnosis not present

## 2022-03-28 MED ORDER — TETANUS-DIPHTH-ACELL PERTUSSIS 5-2.5-18.5 LF-MCG/0.5 IM SUSY
0.5000 mL | PREFILLED_SYRINGE | Freq: Once | INTRAMUSCULAR | Status: AC
  Administered 2022-03-28: 0.5 mL via INTRAMUSCULAR

## 2022-03-28 MED ORDER — TETANUS-DIPHTH-ACELL PERTUSSIS 5-2.5-18.5 LF-MCG/0.5 IM SUSY
PREFILLED_SYRINGE | INTRAMUSCULAR | Status: AC
  Filled 2022-03-28: qty 0.5

## 2022-03-28 MED ORDER — CYCLOBENZAPRINE HCL 10 MG PO TABS: 10.0000 mg | ORAL_TABLET | Freq: Two times a day (BID) | ORAL | 0 refills | Status: DC | PRN

## 2022-03-28 MED ORDER — IBUPROFEN 800 MG PO TABS
800.0000 mg | ORAL_TABLET | Freq: Once | ORAL | Status: AC
  Administered 2022-03-28: 800 mg via ORAL

## 2022-03-28 MED ORDER — IBUPROFEN 800 MG PO TABS: 800.0000 mg | ORAL_TABLET | Freq: Four times a day (QID) | ORAL | 0 refills | Status: DC | PRN

## 2022-03-28 MED ORDER — IBUPROFEN 800 MG PO TABS
ORAL_TABLET | ORAL | Status: AC
  Filled 2022-03-28: qty 1

## 2022-03-28 NOTE — Discharge Instructions (Addendum)
Ibuprofen alternated with tylenol every 4-6 hours  Muscle relaxer twice daily -- or only at bedtime if you get drowsy.  Hot pad or ice. Gentle stretching.   Please return if symptoms persist or worsen despite symptomatic care  We have updated your tetanus shot today
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
## 2022-03-28 NOTE — ED Triage Notes (Signed)
Pt stated the she injured left leg , hip and back on stairs today, causing pain and discomfort

## 2022-04-01 ENCOUNTER — Encounter (HOSPITAL_COMMUNITY): Payer: Self-pay

## 2022-04-01 ENCOUNTER — Ambulatory Visit (HOSPITAL_COMMUNITY)
Admission: RE | Admit: 2022-04-01 | Discharge: 2022-04-01 | Disposition: A | Discharge status: Home / Self Care | Payer: Medicaid Other | Source: Ambulatory Visit | Attending: Emergency Medicine | Admitting: Emergency Medicine

## 2022-04-01 ENCOUNTER — Other Ambulatory Visit: Payer: Self-pay

## 2022-04-01 VITALS — BP 121/75 | HR 60 | Temp 97.9°F | Resp 20

## 2022-04-01 DIAGNOSIS — M25512 Pain in left shoulder: Secondary | ICD-10-CM

## 2022-04-01 MED ORDER — KETOROLAC TROMETHAMINE 60 MG/2ML IM SOLN
60.0000 mg | Freq: Once | INTRAMUSCULAR | Status: AC
  Administered 2022-04-01: 60 mg via INTRAMUSCULAR

## 2022-04-01 MED ORDER — KETOROLAC TROMETHAMINE 60 MG/2ML IM SOLN
INTRAMUSCULAR | Status: AC
  Filled 2022-04-01: qty 2

## 2022-04-01 NOTE — ED Triage Notes (Signed)
Left shoulder pain for 2 days.  Seen at [facility] on Saturday in regards to a fall.  Patient feels pain in shoulder and upper arm if she is picking anything up, or if rotating left arm as in turning a steering wheel.    Patient has been taking ibuprofen and a muscle relaxer.  Medicine helps, "but pain is still there"

## 2022-04-01 NOTE — Discharge Instructions (Signed)
Your pain appears to be musculoskeletal in nature, please continue to use your antiinflammatories and muscle relaxers.  Please use your sling throughout the day to help remind you to stop using your arm.  Please follow-up with a primary care provider who can then refer you to an orthopedic for further evaluation and potential imaging.  Please return to clinic if you have no improvement over the next week, you develop numbness, tingling, or worsening of pain.
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
## 2022-06-11 ENCOUNTER — Encounter (HOSPITAL_COMMUNITY): Payer: Self-pay

## 2022-06-11 ENCOUNTER — Ambulatory Visit (HOSPITAL_COMMUNITY)
Admission: RE | Admit: 2022-06-11 | Discharge: 2022-06-11 | Disposition: A | Discharge status: Home / Self Care | Payer: Medicaid Other | Source: Ambulatory Visit | Attending: Emergency Medicine | Admitting: Emergency Medicine

## 2022-06-11 VITALS — BP 122/57 | HR 60 | Temp 98.6°F | Resp 18

## 2022-06-11 DIAGNOSIS — M25512 Pain in left shoulder: Secondary | ICD-10-CM | POA: Diagnosis not present

## 2022-06-11 DIAGNOSIS — M25522 Pain in left elbow: Secondary | ICD-10-CM

## 2022-06-11 DIAGNOSIS — S46002D Unspecified injury of muscle(s) and tendon(s) of the rotator cuff of left shoulder, subsequent encounter: Secondary | ICD-10-CM

## 2022-06-11 MED ORDER — TIZANIDINE HCL 4 MG PO TABS
4.0000 mg | ORAL_TABLET | Freq: Three times a day (TID) | ORAL | 0 refills | Status: DC | PRN
Start: 2022-06-11 — End: 2022-06-30

## 2022-06-11 MED ORDER — NAPROXEN 500 MG PO TABS: 500.0000 mg | ORAL_TABLET | Freq: Two times a day (BID) | ORAL | 0 refills | Status: DC

## 2022-06-11 MED ORDER — PREDNISONE 10 MG (21) PO TBPK: ORAL_TABLET | ORAL | 0 refills | Status: DC

## 2022-06-11 NOTE — Discharge Instructions (Addendum)
restart the sling, ice, prednisone 6-day taper, stop ibuprofen, take Naprosyn with 1000 mg of Tylenol twice a day, Zanaflex as needed for spasms, try sleeping with a pillow between your body and arm to help decompress your shoulder joint at night.  Follow-up with orthopedics as scheduled.

## 2022-06-11 NOTE — ED Provider Notes (Signed)
HPI  SUBJECTIVE:  Joy [name] is a right-handed 32 y.o. female who presents with left shoulder pain that radiates down to her elbow starting over the past week.  States the pain is waking her up at night. she originally injured this shoulder and elbow in March, where she fell directly onto her elbow.  She was sent home with a sling, ibuprofen, muscle relaxants and states that the symptoms got completely better until last week.  she denies repeat fall, trauma to her shoulder or elbow, repetitive motion with her upper extremity, bruising, swelling.  She tried rest, ibuprofen with temporary relief.  Symptoms are worse with abduction past 90 degrees, driving and with general use.  Patient was seen here on 3/23 for a fall, where she injured her back.  She returned here on 3/27 with shoulder pain from that fall, was given a sling, advised to continue anti-inflammatories and muscle relaxers.  She was told to follow-up with her PCP who could refer her to orthopedics for further evaluation and imaging.  She has a follow-up with orthopedics on 6/12.  She is here today for some relief until she can be evaluated by orthopedics.  No history of diabetes, hypertension, chronic kidney disease, history of left shoulder or elbow injury.  LMP: 2 weeks ago.  Denies possibility of being pregnant.  PCP: [facility].  Past Medical History:  Diagnosis Date   BV (bacterial vaginosis)    Genital herpes     Past Surgical History:  Procedure Laterality Date   WISDOM TOOTH EXTRACTION      Family History  Problem Relation Age of Onset   Healthy Mother    Healthy Father     Social History   Tobacco Use   Smoking status: Former    Types: Cigarettes    Quit date: 11/19/2013    Years since quitting: 8.5   Smokeless tobacco: Never  Vaping Use   Vaping Use: Never used  Substance Use Topics   Alcohol use: Yes    Comment: occasionally   Drug use: Yes    Types: Marijuana    Comment: occasionally    No  current facility-administered medications for this encounter.  Current Outpatient Medications:    naproxen (NAPROSYN) 500 MG tablet, Take 1 tablet (500 mg total) by mouth 2 (two) times daily., Disp: 20 tablet, Rfl: 0   predniSONE (STERAPRED UNI-PAK 21 TAB) 10 MG (21) TBPK tablet, Dispense one 6 day pack. Take as directed with food., Disp: 21 tablet, Rfl: 0   tiZANidine (ZANAFLEX) 4 MG tablet, Take 1 tablet (4 mg total) by mouth every 8 (eight) hours as needed for muscle spasms., Disp: 30 tablet, Rfl: 0  No Known Allergies   ROS  As noted in HPI.   Physical Exam  BP (!) 122/57 (BP Location: Right Arm)   Pulse 60   Temp 98.6 F (37 C) (Oral)   Resp 18   LMP 05/19/2022 (Approximate)   SpO2 97%   Constitutional: Well developed, well nourished, no acute distress Eyes:  EOMI, conjunctiva normal bilaterally HENT: Normocephalic, atraumatic,mucus membranes moist Respiratory: Normal inspiratory effort Cardiovascular: Normal rate GI: nondistended skin: No rash, skin intact Musculoskeletal:  L shoulder with ROM somewhat limited, Drop test painful but negative,  clavicle tender, A/C joint tender, scapula NT , proximal humerus tender, trapezius  tender, shoulder joint tender, Motor strength normal, Sensation intact LT over deltoid region, distal NVI with hand having intact sensation and strength in the median, radial, and ulnar nerve distribution.  no pain with internal rotation,  no pain with external rotation, Positive  tenderness in bicipital groove, positive empty can test,  positive  liftoff test, no instability with abduction/external rotation.  Positive medial epicondyle tenderness.  Supracondylar region, elbow otherwise nontender.  Patient has full range of motion of the elbow.  RP 2+  Neurologic: Alert & oriented x 3, no focal neuro deficits Psychiatric: Speech and behavior appropriate   ED Course   Medications - No data to display  No orders of the defined types were placed in  this encounter.   No results found for this or any previous visit (from the past 24 hour(s)). No results found.  ED Clinical Impression  1. Acute pain of left shoulder   2. Injury of left rotator cuff, subsequent encounter   3. Left elbow pain      ED Assessment/Plan     Suspect rotator cuff injury.  She denies repeat trauma.  She has been 3 months since the original injury, deferring imaging today as I do not think I would change management.  Will have her restart the sling, ice, prednisone 6-day taper, stop ibuprofen, will send home with Naprosyn/Tylenol, try Zanaflex, she is to sleep with a pillow between her body and arm to help decompress her shoulder joint at night, and a work note for tomorrow.  She has follow-up with orthopedics on 6/12.   Discussed  MDM, treatment plan, and plan for follow-up with patient. patient agrees with plan.   Meds ordered this encounter  Medications   naproxen (NAPROSYN) 500 MG tablet    Sig: Take 1 tablet (500 mg total) by mouth 2 (two) times daily.    Dispense:  20 tablet    Refill:  0   tiZANidine (ZANAFLEX) 4 MG tablet    Sig: Take 1 tablet (4 mg total) by mouth every 8 (eight) hours as needed for muscle spasms.    Dispense:  30 tablet    Refill:  0   predniSONE (STERAPRED UNI-PAK 21 TAB) 10 MG (21) TBPK tablet    Sig: Dispense one 6 day pack. Take as directed with food.    Dispense:  21 tablet    Refill:  0      *This clinic note was created using Scientist, clinical (histocompatibility and immunogenetics). Therefore, there may be occasional mistakes despite careful proofreading.  ?    Domenick Gong, MD 06/12/22 1158

## 2022-06-11 NOTE — ED Triage Notes (Signed)
Pt states she is returning from a injury from March. She states she is still having left shoulder pain. She has seen her pcp but the ortho she was sent wasn't covered by her insurance so she has an appt with [facility] next week but she is currently in pain, she does have a shoulder immobilizer PRN

## 2022-06-17 ENCOUNTER — Ambulatory Visit: Payer: Medicaid Other | Attending: Physician Assistant

## 2022-06-17 DIAGNOSIS — M25512 Pain in left shoulder: Secondary | ICD-10-CM | POA: Insufficient documentation

## 2022-06-17 DIAGNOSIS — M6281 Muscle weakness (generalized): Secondary | ICD-10-CM | POA: Diagnosis present
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
## 2022-06-30 ENCOUNTER — Encounter (HOSPITAL_COMMUNITY): Payer: Self-pay

## 2022-06-30 ENCOUNTER — Ambulatory Visit: Payer: Medicaid Other

## 2022-06-30 ENCOUNTER — Ambulatory Visit (HOSPITAL_COMMUNITY)
Admission: RE | Admit: 2022-06-30 | Discharge: 2022-06-30 | Disposition: A | Discharge status: Home / Self Care | Payer: Medicaid Other | Source: Ambulatory Visit | Attending: Emergency Medicine | Admitting: Emergency Medicine

## 2022-06-30 VITALS — BP 111/45 | HR 60 | Temp 98.5°F | Resp 16

## 2022-06-30 DIAGNOSIS — H1033 Unspecified acute conjunctivitis, bilateral: Secondary | ICD-10-CM

## 2022-06-30 MED ORDER — POLYMYXIN B-TRIMETHOPRIM 10000-0.1 UNIT/ML-% OP SOLN: 2.0000 [drp] | Freq: Four times a day (QID) | OPHTHALMIC | 0 refills | Status: DC

## 2022-06-30 MED ORDER — OLOPATADINE HCL 0.2 % OP SOLN: 1.0000 [drp] | Freq: Every day | OPHTHALMIC | 0 refills | Status: DC

## 2022-06-30 MED ORDER — TETRACAINE HCL 0.5 % OP SOLN
OPHTHALMIC | Status: AC
  Filled 2022-06-30: qty 4

## 2022-06-30 MED ORDER — FLUTICASONE PROPIONATE 50 MCG/ACT NA SUSP: 2.0000 | Freq: Every day | NASAL | 0 refills | Status: DC

## 2022-06-30 NOTE — Discharge Instructions (Addendum)
I suspect that this is allergies.  Systane lubricating eyedrops as often as you want, continue compresses.  Try not to rub your eye.  Try Flonase, Claritin or Zyrtec, and the olopatadine first.  If this does not work after several days, then you can start the Polytrim.

## 2022-06-30 NOTE — ED Triage Notes (Addendum)
Patient c/o left eye swelling and pain since this AM. Patient works in a daycare.  Patient's son had pink eye 2 weeks ago.

## 2022-06-30 NOTE — ED Provider Notes (Signed)
HPI  SUBJECTIVE:  Joy [name] is a 32 y.o. female who presents with several days of bilateral eye irritation, mild conjunctival injection.  Reports a gritty, foreign body sensation, crusting in the morning.  she reports painful eyelid swelling and mild pain with extraocular movements this morning.  The eyelid swelling has resolved.  She also has nasal congestion, sneezing, itchy eyes and postnasal drip.  No fevers, headache, photophobia, blurry vision, double vision, purulent drainage.  She tried warm compresses with improvement in her symptoms.  Symptoms are worse with rubbing her eye.  Of note, her son has similar symptoms.  She has a past medical history of seasonal allergies.  LMP: 6/11.  Denies the possibility of being pregnant.  PCP: [facility]   Past Medical History:  Diagnosis Date   BV (bacterial vaginosis)    Genital herpes     Past Surgical History:  Procedure Laterality Date   WISDOM TOOTH EXTRACTION      Family History  Problem Relation Age of Onset   Healthy Mother    Healthy Father     Social History   Tobacco Use   Smoking status: Former    Types: Cigarettes    Quit date: 11/19/2013    Years since quitting: 8.6   Smokeless tobacco: Never  Vaping Use   Vaping Use: Never used  Substance Use Topics   Alcohol use: Yes    Comment: occasionally   Drug use: Yes    Types: Marijuana    Comment: occasionally    No current facility-administered medications for this encounter.  Current Outpatient Medications:    fluticasone (FLONASE) 50 MCG/ACT nasal spray, Place 2 sprays into both nostrils daily., Disp: 16 g, Rfl: 0   Olopatadine HCl 0.2 % SOLN, Apply 1 drop to eye daily., Disp: 2.5 mL, Rfl: 0   trimethoprim-polymyxin b (POLYTRIM) ophthalmic solution, Place 2 drops into both eyes every 6 (six) hours. 1-2 drops 4 times daily to affected eye/s X 5-7 days, Disp: 10 mL, Rfl: 0  No Known Allergies   ROS  As noted in HPI.   Physical Exam  BP (!) 111/45  (BP Location: Left Arm)   Pulse 60   Temp 98.5 F (36.9 C) (Oral)   Resp 16   LMP 06/16/2022 (Approximate)   SpO2 97%   Constitutional: Well developed, well nourished, no acute distress Eyes:  EOMI, PERRLA, mild bilateral conjunctival injection.  Positive mild direct photophobia left eye.  No consensual photophobia.  No foreign body seen on lid eversion bilaterally.  No corneal abrasion seen bilaterally.  No hyphema bilaterally.  No eyelid, periorbital erythema, edema bilaterally. HENT: Normocephalic, atraumatic,mucus membranes moist Respiratory: Normal inspiratory effort Cardiovascular: Normal rate GI: nondistended skin: No rash, skin intact Musculoskeletal: no deformities Neurologic: Alert & oriented x 3, no focal neuro deficits Psychiatric: Speech and behavior appropriate   ED Course   Medications - No data to display  No orders of the defined types were placed in this encounter.   No results found for this or any previous visit (from the past 24 hour(s)). No results found.  ED Clinical Impression  1. Acute conjunctivitis of both eyes, unspecified acute conjunctivitis type      ED Assessment/Plan     Suspect allergic conjunctivitis, although she reports significant improvement in symptoms with the tetracaine.  Claritin or Zyrtec, Flonase, Pataday, Systane, wait-and-see prescription of Polytrim since son has similar symptoms.  Follow-up with PCP.  Work note for 2 days.  Discussed MDM, treatment plan,  and plan for follow-up with patient. patient agrees with plan.   Meds ordered this encounter  Medications   Olopatadine HCl 0.2 % SOLN    Sig: Apply 1 drop to eye daily.    Dispense:  2.5 mL    Refill:  0   fluticasone (FLONASE) 50 MCG/ACT nasal spray    Sig: Place 2 sprays into both nostrils daily.    Dispense:  16 g    Refill:  0   trimethoprim-polymyxin b (POLYTRIM) ophthalmic solution    Sig: Place 2 drops into both eyes every 6 (six) hours. 1-2 drops 4 times  daily to affected eye/s X 5-7 days    Dispense:  10 mL    Refill:  0      *This clinic note was created using Scientist, clinical (histocompatibility and immunogenetics). Therefore, there may be occasional mistakes despite careful proofreading.  ?    Domenick Gong, MD 06/30/22 2025

## 2022-07-08 ENCOUNTER — Ambulatory Visit: Payer: Medicaid Other | Attending: Physician Assistant

## 2022-07-08 DIAGNOSIS — M25512 Pain in left shoulder: Secondary | ICD-10-CM | POA: Insufficient documentation

## 2022-07-08 DIAGNOSIS — M6281 Muscle weakness (generalized): Secondary | ICD-10-CM | POA: Insufficient documentation

## 2022-07-15 ENCOUNTER — Ambulatory Visit: Payer: Medicaid Other

## 2022-07-22 ENCOUNTER — Ambulatory Visit: Payer: Medicaid Other

## 2022-07-22 DIAGNOSIS — M6281 Muscle weakness (generalized): Secondary | ICD-10-CM

## 2022-07-22 DIAGNOSIS — M25512 Pain in left shoulder: Secondary | ICD-10-CM
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
## 2022-07-23 ENCOUNTER — Ambulatory Visit (HOSPITAL_COMMUNITY): Payer: Self-pay

## 2022-07-24 ENCOUNTER — Ambulatory Visit (HOSPITAL_COMMUNITY): Payer: Self-pay

## 2022-07-30 ENCOUNTER — Encounter (HOSPITAL_COMMUNITY): Payer: Self-pay | Admitting: Emergency Medicine

## 2022-07-30 ENCOUNTER — Ambulatory Visit (HOSPITAL_COMMUNITY): Payer: Self-pay

## 2022-07-30 ENCOUNTER — Ambulatory Visit (HOSPITAL_COMMUNITY)
Admission: EM | Admit: 2022-07-30 | Discharge: 2022-07-30 | Disposition: A | Discharge status: Home / Self Care | Payer: Medicaid Other | Attending: Family Medicine | Admitting: Family Medicine

## 2022-07-30 DIAGNOSIS — N760 Acute vaginitis: Secondary | ICD-10-CM | POA: Diagnosis present

## 2022-07-30 MED ORDER — METRONIDAZOLE 0.75 % VA GEL
1.0000 | Freq: Two times a day (BID) | VAGINAL | 0 refills | Status: AC
Start: 2022-07-30 — End: 2022-08-04

## 2022-07-30 NOTE — Discharge Instructions (Signed)
Staff will notify you if there is anything positive on the swab.   Metronidazole gel--1 applicatorful in your vagina 2 times daily for 5 days.
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
## 2022-07-30 NOTE — ED Triage Notes (Signed)
Pt c/o vaginal discharge and itching for about 3-4 months. Reports was seen here before and prescribed Flagyl pills. Pt prefers FLagyl gel.
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
## 2022-08-10 ENCOUNTER — Ambulatory Visit: Payer: Medicaid Other | Attending: Physician Assistant

## 2022-08-10 DIAGNOSIS — M25512 Pain in left shoulder: Secondary | ICD-10-CM

## 2022-08-10 DIAGNOSIS — M6281 Muscle weakness (generalized): Secondary | ICD-10-CM | POA: Diagnosis present
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
## 2022-08-24 ENCOUNTER — Ambulatory Visit: Payer: Medicaid Other

## 2022-12-21 ENCOUNTER — Ambulatory Visit (HOSPITAL_COMMUNITY)
Admission: RE | Admit: 2022-12-21 | Discharge: 2022-12-21 | Disposition: A | Discharge status: Home / Self Care | Payer: Medicaid Other | Source: Ambulatory Visit | Attending: Emergency Medicine | Admitting: Emergency Medicine

## 2022-12-21 ENCOUNTER — Encounter (HOSPITAL_COMMUNITY): Payer: Self-pay

## 2022-12-21 ENCOUNTER — Ambulatory Visit (HOSPITAL_COMMUNITY): Payer: Medicaid Other

## 2022-12-21 VITALS — BP 153/75 | HR 71 | Temp 97.9°F | Resp 18

## 2022-12-21 DIAGNOSIS — M79642 Pain in left hand: Secondary | ICD-10-CM | POA: Insufficient documentation

## 2022-12-21 DIAGNOSIS — N760 Acute vaginitis: Secondary | ICD-10-CM | POA: Insufficient documentation

## 2022-12-21 DIAGNOSIS — M79671 Pain in right foot: Secondary | ICD-10-CM | POA: Diagnosis not present

## 2022-12-21 DIAGNOSIS — M79672 Pain in left foot: Secondary | ICD-10-CM | POA: Diagnosis present

## 2022-12-21 MED ORDER — METRONIDAZOLE 0.75 % VA GEL: 1.0000 | Freq: Every day | VAGINAL | 0 refills | Status: AC

## 2022-12-21 NOTE — Discharge Instructions (Signed)
I do not see any obvious foreign bodies on your foot x-ray.  This could be referred pain from plantar fasciitis.  Continue to massage the area with a frozen water bottle, rolling it underneath your feet.  You can alternate between 500 mg of Tylenol and 600 mg of ibuprofen every 4-6 hours for any pain or inflammation.  This should help your left hand pain as well.   I am treating you for bacterial vaginosis with vaginal applicators nightly for the next 7 days.  Our staff will contact you if anything additional returns on your vaginal swab.  Contact [facility] to set up a primary care provider for chronic or ongoing issues.  Return to clinic for any new or urgent issues.

## 2022-12-21 NOTE — ED Triage Notes (Signed)
Pt c/o vaginal discharge and irritation x3 days after her menstrual cycle. Denies unprotected intercourse.   Pt c/o pain to top of lt foot since yesterday. Denise injury.  Pt c/o pain to rt wrist/hand/palm for months.
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
## 2022-12-22 LAB — CERVICOVAGINAL ANCILLARY ONLY
Bacterial Vaginitis (gardnerella): NEGATIVE
Candida Glabrata: NEGATIVE
Candida Vaginitis: NEGATIVE
Chlamydia: NEGATIVE
Comment: NEGATIVE
Comment: NEGATIVE
Comment: NEGATIVE
Comment: NEGATIVE
Comment: NEGATIVE
Comment: NORMAL
Neisseria Gonorrhea: NEGATIVE
Trichomonas: NEGATIVE

## 2023-03-29 IMAGING — DX DG CHEST 2V
2 series · 2 of 2 positions shown · non-contrast
Comparison: None.

CLINICAL DATA: cough x 5 weeks worsening

EXAM:
CHEST - 2 VIEW

[chest pa]
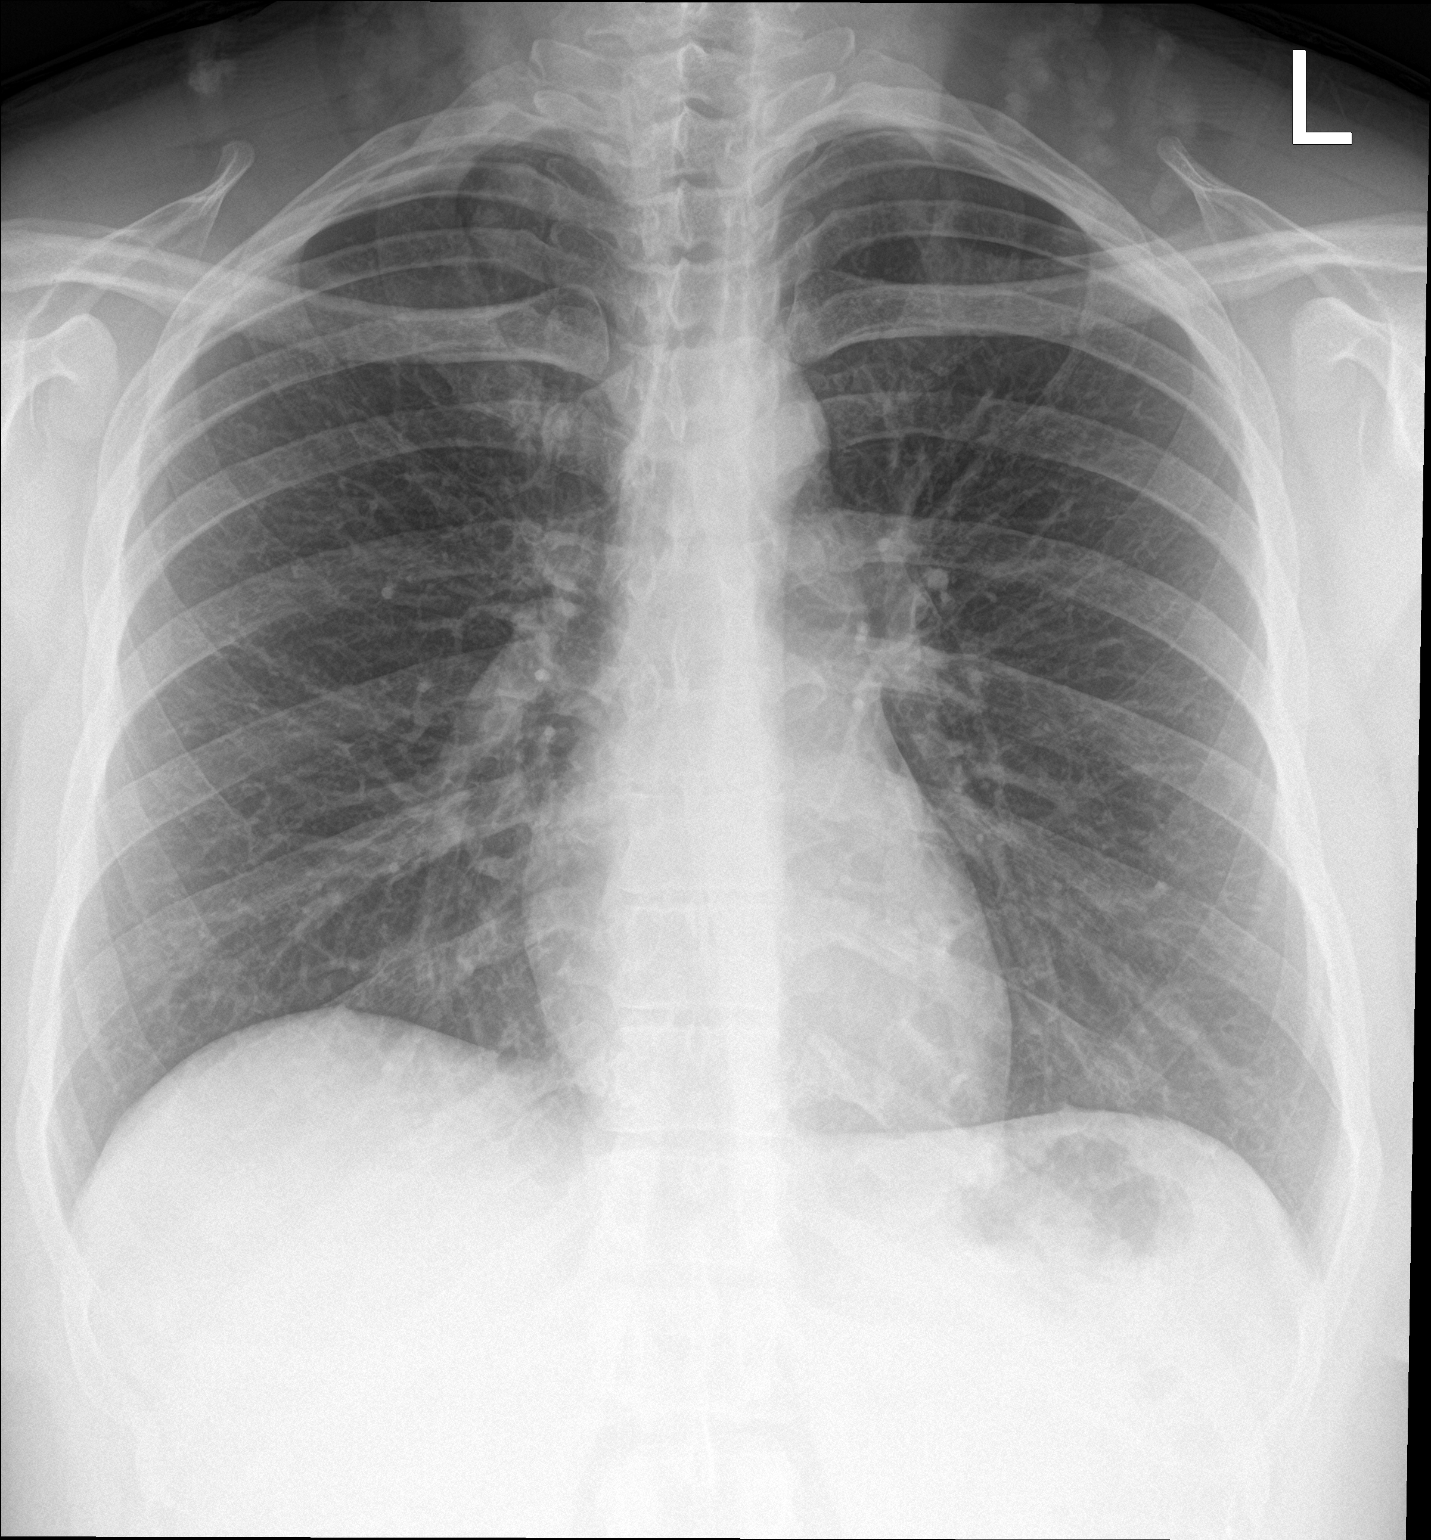

[chest lat]
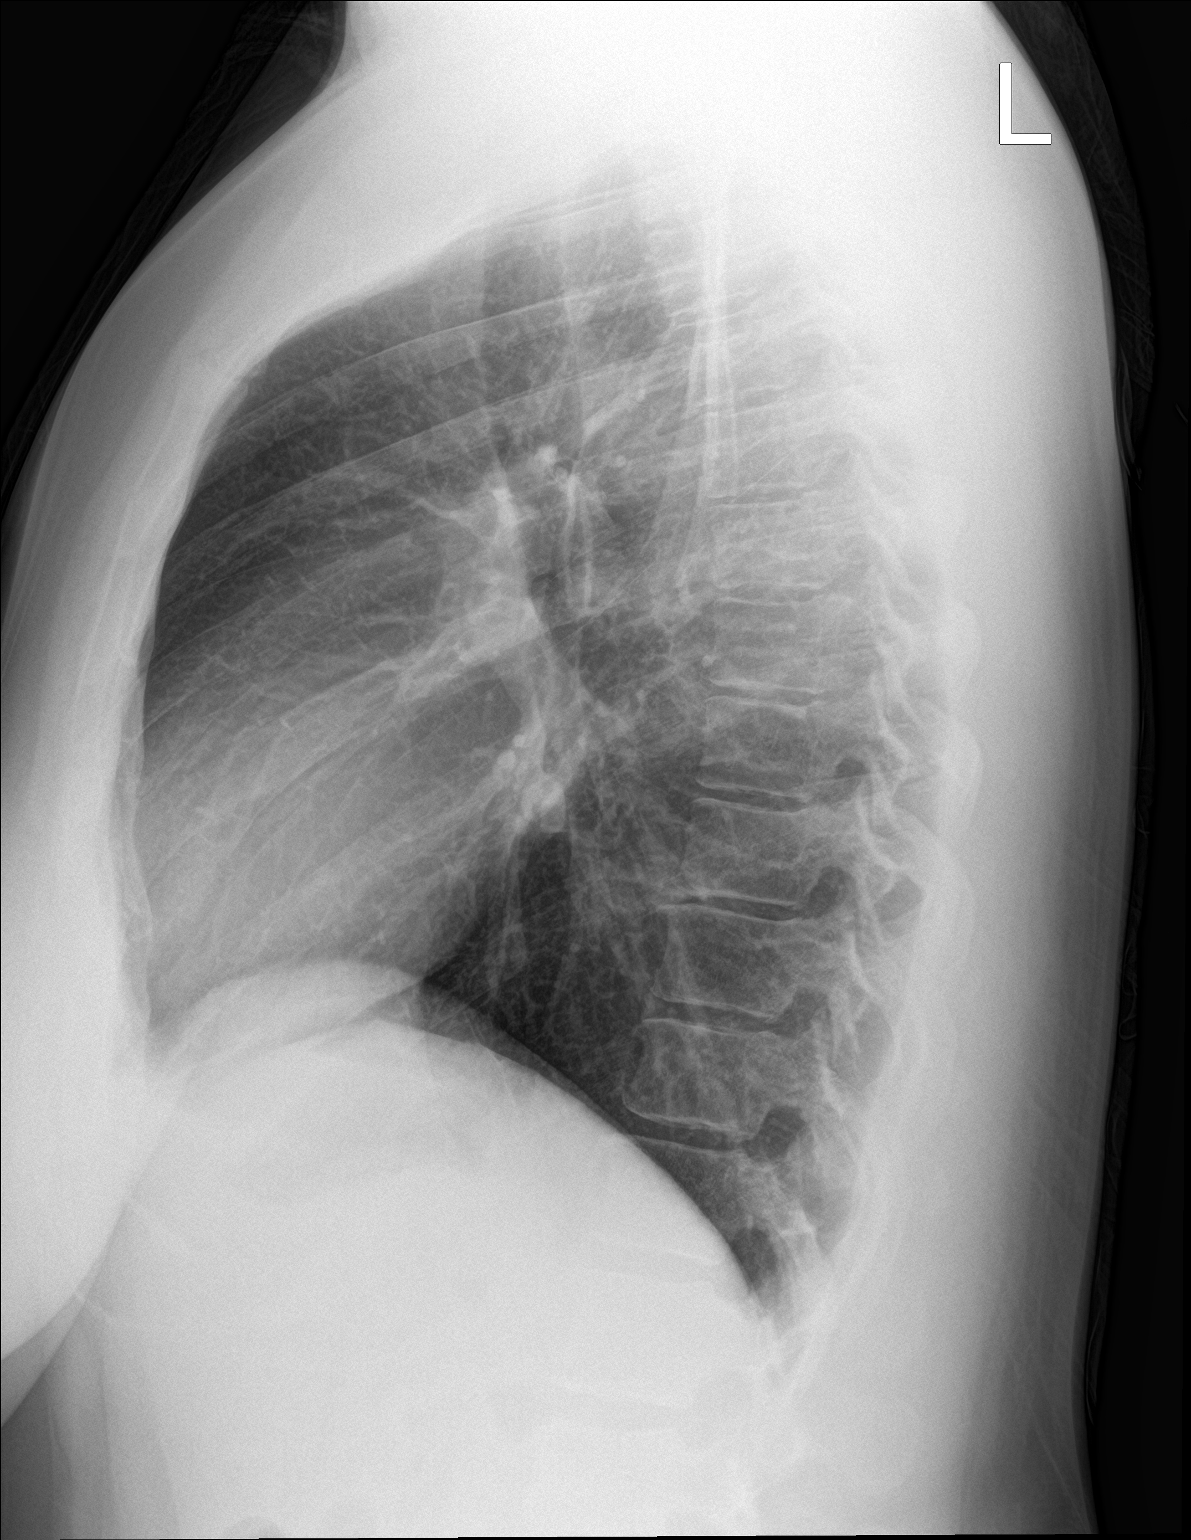

[2 of 2 positions shown; findings below may reference images not displayed]

FINDINGS: The heart size and mediastinal contours are within normal limits.
Both lungs are clear. The visualized skeletal structures are
unremarkable.
IMPRESSION: No evidence of acute cardiopulmonary disease.

## 2023-08-14 ENCOUNTER — Other Ambulatory Visit: Payer: Self-pay

## 2023-08-14 ENCOUNTER — Ambulatory Visit
Admission: RE | Admit: 2023-08-14 | Discharge: 2023-08-14 | Disposition: A | Discharge status: Home / Self Care | Attending: Physician Assistant | Admitting: Physician Assistant

## 2023-08-14 VITALS — BP 109/67 | HR 65 | Temp 98.4°F | Resp 18 | Ht 67.0 in | Wt 240.0 lb

## 2023-08-14 DIAGNOSIS — L253 Unspecified contact dermatitis due to other chemical products: Secondary | ICD-10-CM

## 2023-08-14 MED ORDER — TRIAMCINOLONE ACETONIDE 0.1 % EX CREA: 1.0000 | TOPICAL_CREAM | Freq: Two times a day (BID) | CUTANEOUS | 0 refills | Status: AC
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
## 2023-08-14 NOTE — ED Triage Notes (Signed)
 Pt presents with a chief complaint of rash x 3 weeks. Pt states she went to the pool and did go swimming before the symptoms began. Rash is located on lower abdomen and buttock area. Pt reports there are bumps present and it itches very badly. Currently denies pain, voices overall discomfort. Baby powder applied with no relief or improvement in symptoms.

## 2023-08-14 NOTE — Discharge Instructions (Addendum)
 You were seen today for concerns of a rash on your lower abdomen and back.  At this time I suspect that your rash is due to some kind of contact dermatitis which means that an irritant or allergen came in contact with your skin and has caused aggravation and the itching. To help manage your symptoms I recommend starting a second-generation antihistamine such as Claritin  or Zyrtec. At night if you prefer you can try using Benadryl  but please be advised that this can make you sleepy so do not use it if you need to remain alert or drive.  I am sending in a medication called Kenalog  for you to apply to the affected areas twice per day.  This is a steroid cream that typically helps reduce itching and discomfort.  Please do not use it in the same area for more than 2 weeks as this can cause changes to your skin. If your symptoms are not improving or seem to be worsening you can always return here or follow-up with your primary care provider for further evaluation and ongoing management.

## 2023-12-15 ENCOUNTER — Telehealth: Admitting: Physician Assistant

## 2023-12-15 DIAGNOSIS — B850 Pediculosis due to Pediculus humanus capitis: Secondary | ICD-10-CM

## 2023-12-15 MED ORDER — IVERMECTIN 0.5 % EX LOTN: TOPICAL_LOTION | CUTANEOUS | 0 refills | Status: AC

## 2023-12-15 NOTE — Progress Notes (Signed)
 E-Visit for Lice  We are sorry that you are not feeling well. Here is how we plan to help!  Based on what you have shared with me it looks like you have head lice.  Lice are very tiny insects that like to live on hair.  An infection with head lice is very common in school-age children, but anyone can get lice.  Head lice do not live on pets and cannot jump, fly or walk on the ground.  But they easily pass from person to person through close contact and on clothes, bed linens, brushes, combs, hats and toys.  Head lice infections are not dangerous but they can be difficult to treat.  Lice are contagious and should be treated right away to stop infection from spreading.   I recommend that you use: I have prescribed Ivermectin lotion (Sklice) 0.5%.  This may be used with or without nit combing.  Apply once to the entire area of scalp and hair.  Do not repeat without talking with a healthcare provider.   HOME CARE:  You should treat all members in your household. Wash towels, clothes, bed linens, cloth toys, hats and other personal items in hot water and dry on high heat. Wash all combs and brushes in very hot soapy water or throw them out and buy new ones. Vacuum floors and furniture and throw out the bag afterwards. Do not go to work or school until the morning after your treatment for lice. If family members are also infected, you may need to notify your child's day care or school so that other children can be checked.  GET HELP RIGHT AWAY IF:  Your treatment does not get rid of lice. You develop sores on your scalp that become infected, worsen or do not heal. You or your child become itchy or are scratching in areas other than the scalp.  MAKE SURE YOU:  Understand these instructions. Will watch your condition. Will get help right away if you are not doing well or get worse.  Your e-visit answers were reviewed by a board certified advanced clinical practitioner to complete your personal  care plan.  Depending upon the condition, your plan could have included both over the counter or prescription medications.    Please review your pharmacy choice.  Make sure the pharmacy is open so you can pick up prescription now.   If there is a problem, you may contact your provider through [employer] and have the prescription routed to another pharmacy. Your safety is important to [place].  If you have drug allergies check your prescription carefully.    For the next 24 hours you can use MyChart to ask questions about today's visit, request a non-urgent call back, or ask for a work or school excuse.  You will get an email in the next 2 days asking about your experience.  I hope that your e-visit has been valuable and will speed your recovery.   I have spent 5 minutes in review of e-visit questionnaire, review and updating patient chart, medical decision making and response to patient.   Margaretann Loveless, PA-C
